# Patient Record
Sex: Male | Born: 1970 | Race: White | Hispanic: No | Marital: Married | Smoking: Never smoker
Health system: Southern US, Community
[De-identification: ages and names within clinical notes are randomized; demographics above are authoritative.]

## PROBLEM LIST (undated history)

## (undated) DIAGNOSIS — T7840XA Allergy, unspecified, initial encounter: Secondary | ICD-10-CM

## (undated) DIAGNOSIS — G43909 Migraine, unspecified, not intractable, without status migrainosus: Secondary | ICD-10-CM

## (undated) DIAGNOSIS — Z8659 Personal history of other mental and behavioral disorders: Secondary | ICD-10-CM

## (undated) DIAGNOSIS — R519 Headache, unspecified: Secondary | ICD-10-CM

## (undated) HISTORY — DX: Headache, unspecified: R51.9

## (undated) HISTORY — PX: FINGER FRACTURE SURGERY: SHX638

## (undated) HISTORY — PX: FRACTURE SURGERY: SHX138

## (undated) HISTORY — DX: Personal history of other mental and behavioral disorders: Z86.59

## (undated) HISTORY — DX: Migraine, unspecified, not intractable, without status migrainosus: G43.909

## (undated) HISTORY — DX: Allergy, unspecified, initial encounter: T78.40XA

## (undated) HISTORY — PX: CYST EXCISION: SHX5701

---

## 2019-03-02 ENCOUNTER — Other Ambulatory Visit: Payer: Self-pay

## 2019-03-02 ENCOUNTER — Encounter: Payer: Self-pay | Admitting: Urology

## 2019-03-02 ENCOUNTER — Ambulatory Visit (INDEPENDENT_AMBULATORY_CARE_PROVIDER_SITE_OTHER): Payer: 59 | Admitting: Urology

## 2019-03-02 VITALS — BP 146/84 | HR 76 | Ht 74.0 in | Wt 218.0 lb

## 2019-03-02 DIAGNOSIS — Z3009 Encounter for other general counseling and advice on contraception: Secondary | ICD-10-CM | POA: Diagnosis not present

## 2019-03-02 MED ORDER — DIAZEPAM 5 MG PO TABS: 5.0000 mg | ORAL_TABLET | Freq: Once | ORAL | 0 refills | Status: DC | PRN

## 2019-03-02 NOTE — Progress Notes (Signed)
03/02/19 2:35 PM   Ardyth Man Nov 11, 1970 [id number]  CC: Discuss vasectomy  HPI: I saw Mr. Braunstein for evaluation of vasectomy today in clinic.  He is a healthy 48 year old male who is married with 2 children ages 22 and 91.  His wife is currently on oral contraceptive pills, but is having some issues, and they would desire permanent sterilization with vasectomy.  He is otherwise healthy.  He denies a family history of prostate cancer.   PMH: Past Medical History:  Diagnosis Date  . Headache     Surgical History: No pertinent surgical history  Allergies:  Allergies  Allergen Reactions  . Codeine Shortness Of Breath  . Penicillins Hives    Family History: Denies family history of prostate cancer  Social History:  reports that he has never smoked. He has never used smokeless tobacco. He reports current alcohol use. No history on file for drug.  ROS: Please see flowsheet from today's date for complete review of systems.  Physical Exam: BP (!) 146/84   Pulse 76   Ht 6\' 2"  (1.88 m)   Wt 218 lb (98.9 kg)   BMI 27.99 kg/m    Constitutional:  Alert and oriented, No acute distress. Cardiovascular: No clubbing, cyanosis, or edema. Respiratory: Normal respiratory effort, no increased work of breathing. GI: Abdomen is soft, nontender, nondistended, no abdominal masses GU: No CVA tenderness, phallus without lesions, widely patent meatus, testicles 20 cc, no masses, vas deferens easily palpable bilaterally Lymph: No cervical or inguinal lymphadenopathy. Skin: No rashes, bruises or suspicious lesions. Neurologic: Grossly intact, no focal deficits, moving all 4 extremities. Psychiatric: Normal mood and affect.  Assessment & Plan:   In summary, he is a healthy 48 year old male who desires surgical sterilization with vasectomy.  We discussed the risks and benefits of vasectomy at length.  Vasectomy is intended to be a permanent form of contraception, and does not produce  immediate sterility.  Following vasectomy another form of contraception is required until vas occlusion is confirmed by a post-vasectomy semen analysis obtained 2-3 months after the procedure.  Even after vas occlusion is confirmed, vasectomy is not 100% reliable in preventing pregnancy, and the failure rate is approximately 05/1998.  Repeat vasectomy is required in less than 1% of patients.  He should refrain from ejaculation for 1 week after vasectomy.  Options for fertility after vasectomy include vasectomy reversal, and sperm retrieval with in vitro fertilization or ICSI.  These options are not always successful and may be expensive.  Finally, there are other permanent and non-permanent alternatives to vasectomy available. There is no risk of erectile dysfunction, and the volume of semen will be similar to prior, as the majority of the ejaculate is from the prostate and seminal vesicles.   The procedure takes ~20 minutes.  We recommend patients take 5-10 mg of Valium 30 minutes prior, and he will need a driver post-procedure.  Local anesthetic is injected into the scrotal skin and a small segment of the vas deferens is removed, and the ends occluded. The complication rate is approximately 1-2%, and includes bleeding, infection, and development of chronic scrotal pain.  PLAN: Pending insurance approval, will schedule for vasectomy at his convenience   A total of 30 minutes were spent face-to-face with the patient, greater than 50% was spent in patient education, counseling, and coordination of care regarding risks, benefits, and alternatives to vasectomy.   Billey Co, MD  [facility] [address] [place], [place] [address] [phone]  [phone]   

## 2019-03-02 NOTE — Patient Instructions (Signed)

## 2019-04-04 ENCOUNTER — Ambulatory Visit (INDEPENDENT_AMBULATORY_CARE_PROVIDER_SITE_OTHER): Payer: 59 | Admitting: Urology

## 2019-04-04 ENCOUNTER — Encounter: Payer: Self-pay | Admitting: Urology

## 2019-04-04 ENCOUNTER — Other Ambulatory Visit: Payer: Self-pay

## 2019-04-04 VITALS — BP 129/80 | HR 64 | Ht 74.0 in | Wt 218.0 lb

## 2019-04-04 DIAGNOSIS — Z302 Encounter for sterilization: Secondary | ICD-10-CM

## 2019-04-04 HISTORY — PX: VASECTOMY: SHX75

## 2019-04-04 NOTE — Patient Instructions (Signed)
Vasectomy, Care After  This sheet gives you information about how to care for yourself after your procedure. Your health care provider may also give you more specific instructions. If you have problems or questions, contact your health care provider.  What can I expect after the procedure?  After your procedure, it is common to have:   Mild pain, swelling, redness, or discomfort in your scrotum.   Some blood coming from your incisions or puncture sites for one or two days.   Blood in your semen.  Follow these instructions at home:  Medicines     Take over-the-counter and prescription medicines only as told by your health care provider.   Avoid taking NSAIDs such as aspirin and ibuprofen, because these medicines can make bleeding worse.  Activity   For the first 2 days after surgery, avoid physical activity and exercise that require a lot of energy. Ask your health care provider what activities are safe for you.   Do not participate in sports or perform heavy physical labor until your pain has improved, or until your health care provider says it is okay.   Do not ejaculate for at least 1 week after the procedure, or as long as directed.   You may resume sexual activity 7-10 days after your procedure, or when your health care provider approves. Use a different method of birth control (contraception) until you have had test results that confirm that there is no sperm in your semen.  Scrotal support   Use scrotal support, such as a jock strap or underwear with a supportive pouch, as needed for one week after your procedure.   If you feel discomfort in your scrotum, you may remove the scrotal support to see if the discomfort is relieved. Sometimes scrotal support can press on the scrotum and cause or worsen discomfort.   If your skin gets irritated, you may add some germ-free (sterile), fluffed bandages or a clean washcloth to the scrotal support.  General instructions   Put ice on the injured area:  ? Put  ice in a plastic bag.  ? Place a towel between your skin and the bag.  ? Leave the ice on for 20 minutes, 2-3 times a day.   Check your incisions or puncture sites every day for signs of infection. Check for:  ? Redness, swelling, or pain.  ? Fluid or blood.  ? Warmth.  ? Pus or a bad smell.   Leave stitches (sutures) in place. The sutures will dissolve on their own and do not need to be removed.   Keep all follow-up visits as told by your health care provider. This is important because you will need a test to confirm that there is no sperm in your semen. Multiple ejaculations are needed to clear out sperm that were beyond the vasectomy site. You will need one test result showing that there is no sperm in your semen before you can resume unprotected sex. This may take 2-4 months after your procedure.   Do not drive for 24 hours if you were given a sedative to help you relax.  Contact a health care provider if:   You have redness, swelling, or more pain around your incision or puncture site, or in your scrotum area in general.   You have bleeding from your incision or puncture site.   You have pus or a bad smell coming from your incision or puncture site.   You have a fever.   Your incision or   Avoid physical activity and exercise that requires a lot of energy for the first 2 days after surgery.  Put ice on the injured area. Leave the ice on for 20 minutes, 2-3 times a day.  Do not drive for 24 hours if you were given a sedative to help you relax. This information is not intended to replace advice given to you by your health care provider. Make sure you discuss any questions you have with your health care provider. Document Released: 11/22/2004 Document Revised: 04/17/2017 Document  Reviewed: 08/01/2016 Elsevier Patient Education  [place].

## 2019-04-04 NOTE — Progress Notes (Signed)
VASECTOMY PROCEDURE NOTE:  The patient was taken to the minor procedure room and placed in the supine position. His genitals were prepped and draped in the usual sterile fashion. The right vas deferens was brought up to the skin of the right upper scrotum. The skin overlying it was anesthetized with 1% lidocaine without epinephrine, anesthetic was also injected alongside the vas deferens in the direction of the inguinal canal. The no scalpel vasectomy instrument was used to make a small perforation in the scrotal skin. The vasectomy clamp was used to grasp the vas deferens. It was carefully dissected free from surrounding structures. A 1cm segment of the vas was removed, and the cut ends of the mucosa were cauterized. A figure of eight suture was used to perform fascial interposition. No significant bleeding was noted. The vas deferens was returned to the scrotum. The skin incision was closed with a simple interrupted stitch of 4-0 chromic.  Attention was then turned to the left side. The left vasectomy was performed in the same exact fashion. Sterile dressings were placed over each incision. The patient tolerated the procedure well.  IMPRESSION/DIAGNOSIS: The patient is a 48 year old gentleman who underwent a vasectomy today. Post-procedure instructions were reviewed. I stressed the importance of continuing to use birth control until he provides a semen specimen more than 2 months from now that demonstrates azoospermia.  We discussed return precautions including fever over 101, significant bleeding or hematoma, or uncontrolled pain. I also stressed the importance of avoiding strenuous activity for one week, no sexual activity or ejaculations for 5 days, intermittent icing over the next 48 hours, and scrotal support.   PLAN: The patient will be advised of his semen analysis results when available.   Nickolas Madrid, MD 04/04/2019

## 2019-06-30 ENCOUNTER — Other Ambulatory Visit: Payer: 59

## 2019-07-07 ENCOUNTER — Other Ambulatory Visit: Payer: 59

## 2019-07-14 ENCOUNTER — Other Ambulatory Visit: Payer: Self-pay

## 2019-07-14 ENCOUNTER — Other Ambulatory Visit: Payer: 59

## 2019-07-14 DIAGNOSIS — Z302 Encounter for sterilization: Secondary | ICD-10-CM

## 2019-07-15 LAB — POST-VAS SPERM EVALUATION,QUAL: Volume: 1.4 mL

## 2019-07-18 ENCOUNTER — Telehealth: Payer: Self-pay

## 2019-07-18 NOTE — Telephone Encounter (Signed)
Called pt informed him of the information below. Pt gave verbal understanding.  

## 2019-07-18 NOTE — Telephone Encounter (Signed)
-----   Message from Billey Co, MD sent at 07/15/2019  5:28 PM EST ----- No sperm seen, ok to stop alternative protection  Nickolas Madrid, MD 07/15/2019

## 2019-09-10 ENCOUNTER — Ambulatory Visit: Payer: Self-pay | Attending: Internal Medicine

## 2019-09-10 DIAGNOSIS — Z23 Encounter for immunization: Secondary | ICD-10-CM

## 2019-09-10 NOTE — Progress Notes (Signed)
   Covid-19 Vaccination Clinic  Name:  Joel Oconnor    MRN: [id number] DOB: 01-Jan-1971  09/10/2019  Mr. Joel Oconnor was observed post Covid-19 immunization for 15 minutes without incident. He was provided with Vaccine Information Sheet and instruction to access the V-Safe system.   Mr. Joel Oconnor was instructed to call 911 with any severe reactions post vaccine: Marland Kitchen Difficulty breathing  . Swelling of face and throat  . A fast heartbeat  . A bad rash all over body  . Dizziness and weakness   Immunizations Administered    Name Date Dose VIS Date Route   Pfizer COVID-19 Vaccine 09/10/2019 10:58 AM 0.3 mL 07/13/2018 Intramuscular   Manufacturer: [place]   Lot: [id number]   [place]: [id number]

## 2019-10-08 ENCOUNTER — Ambulatory Visit: Payer: Self-pay | Attending: Internal Medicine

## 2019-10-08 DIAGNOSIS — Z23 Encounter for immunization: Secondary | ICD-10-CM

## 2019-10-08 NOTE — Progress Notes (Signed)
   Covid-19 Vaccination Clinic  Name:  Joel Oconnor    MRN: [id number] DOB: 16-Feb-1971  10/08/2019  Joel Oconnor was observed post Covid-19 immunization for 15 minutes without incident. He was provided with Vaccine Information Sheet and instruction to access the V-Safe system.   Joel Oconnor was instructed to call 911 with any severe reactions post vaccine: Marland Kitchen Difficulty breathing  . Swelling of face and throat  . A fast heartbeat  . A bad rash all over body  . Dizziness and weakness   Immunizations Administered    Name Date Dose VIS Date Route   Pfizer COVID-19 Vaccine 10/08/2019 10:42 AM 0.3 mL 07/13/2018 Intramuscular   Manufacturer: [employer], [employer]   Lot: [id number]   [place]: [id number]

## 2021-09-12 ENCOUNTER — Telehealth: Payer: Self-pay

## 2021-09-12 ENCOUNTER — Ambulatory Visit (INDEPENDENT_AMBULATORY_CARE_PROVIDER_SITE_OTHER)
Admission: RE | Admit: 2021-09-12 | Discharge: 2021-09-12 | Disposition: A | Discharge status: Home / Self Care | Payer: 59 | Source: Ambulatory Visit | Attending: Nurse Practitioner | Admitting: Nurse Practitioner

## 2021-09-12 ENCOUNTER — Ambulatory Visit (INDEPENDENT_AMBULATORY_CARE_PROVIDER_SITE_OTHER): Payer: 59 | Admitting: Nurse Practitioner

## 2021-09-12 ENCOUNTER — Other Ambulatory Visit: Payer: Self-pay

## 2021-09-12 ENCOUNTER — Encounter: Payer: Self-pay | Admitting: Nurse Practitioner

## 2021-09-12 VITALS — BP 132/94 | HR 73 | Temp 97.5°F | Resp 12 | Ht 74.0 in | Wt 230.1 lb

## 2021-09-12 DIAGNOSIS — Z0000 Encounter for general adult medical examination without abnormal findings: Secondary | ICD-10-CM

## 2021-09-12 DIAGNOSIS — E663 Overweight: Secondary | ICD-10-CM

## 2021-09-12 DIAGNOSIS — Z1211 Encounter for screening for malignant neoplasm of colon: Secondary | ICD-10-CM

## 2021-09-12 DIAGNOSIS — M25511 Pain in right shoulder: Secondary | ICD-10-CM

## 2021-09-12 DIAGNOSIS — Z23 Encounter for immunization: Secondary | ICD-10-CM | POA: Diagnosis not present

## 2021-09-12 DIAGNOSIS — G8929 Other chronic pain: Secondary | ICD-10-CM

## 2021-09-12 LAB — CBC WITH DIFFERENTIAL/PLATELET
Basophils Absolute: 0 10*3/uL (ref 0.0–0.1)
Basophils Relative: 0.4 % (ref 0.0–3.0)
Eosinophils Absolute: 0.1 10*3/uL (ref 0.0–0.7)
Eosinophils Relative: 1.1 % (ref 0.0–5.0)
HCT: 45.4 % (ref 39.0–52.0)
Hemoglobin: 15.5 g/dL (ref 13.0–17.0)
Lymphocytes Relative: 31.3 % (ref 12.0–46.0)
Lymphs Abs: 1.8 10*3/uL (ref 0.7–4.0)
MCHC: 34.1 g/dL (ref 30.0–36.0)
MCV: 82.4 fl (ref 78.0–100.0)
Monocytes Absolute: 0.5 10*3/uL (ref 0.1–1.0)
Monocytes Relative: 9.1 % (ref 3.0–12.0)
Neutro Abs: 3.4 10*3/uL (ref 1.4–7.7)
Neutrophils Relative %: 58.1 % (ref 43.0–77.0)
Platelets: 324 10*3/uL (ref 150.0–400.0)
RBC: 5.51 Mil/uL (ref 4.22–5.81)
RDW: 13.8 % (ref 11.5–15.5)
WBC: 5.9 10*3/uL (ref 4.0–10.5)

## 2021-09-12 LAB — TSH: TSH: 4.6 u[IU]/mL (ref 0.35–5.50)

## 2021-09-12 LAB — LIPID PANEL
Cholesterol: 273 mg/dL — ABNORMAL HIGH (ref 0–200)
HDL: 46.2 mg/dL (ref >39.00)
LDL Cholesterol: 191 mg/dL — ABNORMAL HIGH (ref 0–99)
NonHDL: 226.74
Total CHOL/HDL Ratio: 6
Triglycerides: 178 mg/dL — ABNORMAL HIGH (ref 0.0–149.0)
VLDL: 35.6 mg/dL (ref 0.0–40.0)

## 2021-09-12 LAB — COMPREHENSIVE METABOLIC PANEL
ALT: 29 U/L (ref 0–53)
AST: 21 U/L (ref 0–37)
Albumin: 4.9 g/dL (ref 3.5–5.2)
Alkaline Phosphatase: 82 U/L (ref 39–117)
BUN: 15 mg/dL (ref 6–23)
CO2: 23 mEq/L (ref 19–32)
Calcium: 9.2 mg/dL (ref 8.4–10.5)
Chloride: 104 mEq/L (ref 96–112)
Creatinine, Ser: 0.96 mg/dL (ref 0.40–1.50)
GFR: 92.62 mL/min (ref >60.00)
Glucose, Bld: 81 mg/dL (ref 70–99)
Potassium: 4.4 mEq/L (ref 3.5–5.1)
Sodium: 137 mEq/L (ref 135–145)
Total Bilirubin: 0.5 mg/dL (ref 0.2–1.2)
Total Protein: 7.2 g/dL (ref 6.0–8.3)

## 2021-09-12 LAB — HEMOGLOBIN A1C: Hgb A1c MFr Bld: 5.6 % (ref 4.6–6.5)

## 2021-09-12 MED ORDER — NA SULFATE-K SULFATE-MG SULF 17.5-3.13-1.6 GM/177ML PO SOLN: 1.0000 | Freq: Once | ORAL | 0 refills | Status: AC

## 2021-09-12 NOTE — Progress Notes (Signed)
? ?New Patient Office Visit ? ?Subjective   ? ?Patient ID: Joel Oconnor, male    DOB: 1970-06-22  Age: 51 y.o. MRN: [id number] ? ?CC:  ?Chief Complaint  ?Patient presents with  ? Establish Care  ?  Previous PCP over 25 years ago  ? Shoulder Pain  ?  Right shoulder pain off and on for 1 year. Thinks it was from hyperextending it about a year ago grabbing something in a awkward position at that time. At first when this happened for about 3 to 4 days he could not move his arm at all.   ? ? ?HPI ?[name] presents to establish care ?for complete physical and follow up of chronic conditions. ? ?Immunizations: ?-Tetanus:needs updating ?-Influenza:refused ?-Covid-19:pfizer x2  ?-Shingles: ?-Pneumonia:  ? ?-HPV: ? ?Diet: Fair diet.  2 meals a day. Snacks in between. More water and 2 cups of coffee a day. ?Exercise: No regular exercise. ? ?Eye exam: Completes annually. 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 ?Lung Cancer Screening: NA ? ?Sleep: 11pm-1am bedtime and gets up between 730-8am. Feels rested somedays. Snores. ? ?Shoulder, right: states approx 1-1.5 years ago. States it felt like a pull. It was an awkard position when getting something. States his wife is a OTA. She thinks it is bicep muscle. Has not tried OTC medicine. Denies numbness or tingling or weakness ? ?Migraine: any time weather changes. He will use excedrin. At least monthly. Excedrin does help and he will sleep. Photosen, smells, sound. Vary in location. Will obtian a rainbow halo with them at times ?Has had N/V with them. Has been evaluated by nuerology. [place] in the past. ? ?Headaches:several times a week.  He would not use Excedrin all the times.  Lots time just ignores them. ? ? ?Outpatient Encounter Medications as of 09/12/2021  ?Medication Sig  ? aspirin-acetaminophen-caffeine (EXCEDRIN MIGRAINE) 250-250-65 MG tablet Take by mouth every 6 (six) hours as needed for headache.  ?  [DISCONTINUED] diazepam (VALIUM) 5 MG tablet Take 1 tablet (5 mg total) by mouth once as needed for up to 1 dose (take 30 minutes prior to vasectomy).  ? ?No facility-administered encounter medications on file as of 09/12/2021.  ? ? ?Past Medical History:  ?Diagnosis Date  ? Allergy   ? Headache   ? History of ADHD   ? Migraine   ? ? ? ? ?Family History  ?Problem Relation Age of Onset  ? COPD Father   ?     on oxygen  ? Hypertension Father   ? Lung cancer Father   ? Other Father   ?     asbestosis  ? Heart attack Father   ?     has 2 stents  ? Hypertension Sister   ? Liver cancer Sister   ? Stroke Maternal Grandfather   ? Memory loss Paternal Grandfather   ? ? ?Social History  ? ?Socioeconomic History  ? Marital status: Married  ?  Spouse name: Not on file  ? Number of children: 2  ? Years of education: Not on file  ? Highest education level: Bachelor's degree (e.g., BA, AB, BS)  ?Occupational History  ? Not on file  ?Tobacco Use  ? Smoking status: Never  ? Smokeless tobacco: Never  ?Vaping Use  ? Vaping Use: Never used  ?Substance and Sexual Activity  ? Alcohol use: Yes  ?  Comment: maybe a 6 pack a year  ? Drug use: Never  ?  Sexual activity: Yes  ?  Birth control/protection: Surgical  ?  Comment: Vasectomy 04/04/2019  ?Other Topics Concern  ? Not on file  ?Social History Narrative  ? Fulltime: Careers information officer  ? Estate manager/land agent  ? ?Social Determinants of Health  ? ?Financial Resource Strain: Not on file  ?Food Insecurity: Not on file  ?Transportation Needs: Not on file  ?Physical Activity: Not on file  ?Stress: Not on file  ?Social Connections: Not on file  ?Intimate Partner Violence: Not on file  ? ? ?Review of Systems  ?Constitutional:  Negative for chills, fever and malaise/fatigue.  ?Respiratory:  Positive for shortness of breath. Negative for cough.   ?Cardiovascular:  Positive for chest pain and leg swelling. Negative for palpitations.  ?Gastrointestinal:  Negative for abdominal pain, diarrhea, nausea  and vomiting.  ?     BM daily ?  ?Genitourinary:  Negative for dysuria and hematuria.  ?     Nocturia with fluid intake  ?Musculoskeletal:  Positive for joint pain.  ?Neurological:  Positive for headaches.  ?Psychiatric/Behavioral:  Negative for hallucinations and suicidal ideas.   ?     History of SI and self injury as an teenager.  ? ?  ? ? ?Objective   ? ?BP (!) 132/94   Pulse 73   Temp (!) 97.5 ?F (36.4 ?C)   Resp 12   Ht '6\' 2"'$  (1.88 m)   Wt 230 lb 1 oz (104.4 kg)   SpO2 95%   BMI 29.54 kg/m?  ? ?Physical Exam ?Vitals and nursing note reviewed. Exam conducted with a chaperone present [employer] Odee, CMA).  ?Constitutional:   ?   Appearance: Normal appearance.  ?HENT:  ?   Right Ear: Tympanic membrane, ear canal and external ear normal.  ?   Left Ear: Tympanic membrane, ear canal and external ear normal.  ?   Mouth/Throat:  ?   Mouth: Mucous membranes are moist.  ?   Pharynx: Oropharynx is clear.  ?Eyes:  ?   Extraocular Movements: Extraocular movements intact.  ?   Pupils: Pupils are equal, round, and reactive to light.  ?Cardiovascular:  ?   Rate and Rhythm: Normal rate and regular rhythm.  ?   Pulses: Normal pulses.  ?   Heart sounds: Normal heart sounds.  ?Pulmonary:  ?   Effort: Pulmonary effort is normal.  ?   Breath sounds: Normal breath sounds.  ?Abdominal:  ?   General: Bowel sounds are normal. There is no distension.  ?   Palpations: There is no mass.  ?   Tenderness: There is no abdominal tenderness.  ?   Hernia: No hernia is present. There is no hernia in the left inguinal area or right inguinal area.  ?Genitourinary: ?   Penis: Normal and circumcised.   ?   Testes: Normal.  ?   Epididymis:  ?   Right: Normal.  ?   Left: Normal.  ?Musculoskeletal:     ?   General: Tenderness present. No signs of injury.  ?     Arms: ? ?   Right lower leg: No edema.  ?   Comments: Area is inferior to joint space. Pec muscle non tender. Bicep muscle non tender. ? ?FROM in office.  ?Empty can test  negative ?Hawkins-Kennedy test negative   ?Lymphadenopathy:  ?   Cervical: No cervical adenopathy.  ?   Lower Body: No right inguinal adenopathy. No left inguinal adenopathy.  ?Skin: ?   General: Skin is warm.  ?  Neurological:  ?   General: No focal deficit present.  ?   Mental Status: He is alert.  ?   Deep Tendon Reflexes:  ?   Reflex Scores: ?     Bicep reflexes are 2+ on the right side and 2+ on the left side. ?     Patellar reflexes are 2+ on the right side and 2+ on the left side. ?   Comments: Bilateral upper and lower extremity 5/5  ?Psychiatric:     ?   Mood and Affect: Mood normal.     ?   Behavior: Behavior normal.     ?   Thought Content: Thought content normal.     ?   Judgment: Judgment normal.  ? ? ? ?  ? ?Assessment & Plan:  ? ?Problem List Items Addressed This Visit   ? ?  ? Other  ? Overweight  ? Relevant Orders  ? Hemoglobin A1c  ? Preventative health care  ?  Discussed age-appropriate immunizations and screening exams. ? ?  ?  ? Relevant Orders  ? CBC with Differential/Platelet  ? Comprehensive metabolic panel  ? Hemoglobin A1c  ? TSH  ? Lipid panel  ? Chronic right shoulder pain - Primary  ?  Patient has been experiencing for over a year.  It is intermittent in nature.  Does not try over-the-counter medications.  Will obtain x-ray of right shoulder to rule out any bony abnormality inclusive of arthritis and bone spurs.  If this keeps happening we can consider physical therapy or sending him to our sports medicine physician Dr. Lorelei Pont for consultation. ? ?  ?  ? Relevant Orders  ? DG Shoulder Right  ? ?Other Visit Diagnoses   ? ? Need for tetanus booster      ? Relevant Orders  ? Tdap vaccine greater than or equal to 7yo IM (Completed)  ? Screening for colon cancer      ? Relevant Orders  ? Ambulatory referral to Gastroenterology  ? ?  ? ? ?Return in about 1 year (around 09/13/2022) for cpe.  ? ?Romilda Garret, NP ? ? ?

## 2021-09-12 NOTE — Assessment & Plan Note (Signed)
Patient has been experiencing for over a year.  It is intermittent in nature.  Does not try over-the-counter medications.  Will obtain x-ray of right shoulder to rule out any bony abnormality inclusive of arthritis and bone spurs.  If this keeps happening we can consider physical therapy or sending him to our sports medicine physician Dr. Lorelei Pont for consultation. ?

## 2021-09-12 NOTE — Progress Notes (Signed)
Colonoscopy 09/17/21 with Dr. Vicente Males at [facility]. ?

## 2021-09-12 NOTE — Telephone Encounter (Signed)
Gastroenterology Pre-Procedure Review ? ?Request Date: 09/17/21 ?Requesting Physician: Dr. Vicente Males ? ?PATIENT REVIEW QUESTIONS: The patient responded to the following health history questions as indicated:   ? ?1. Are you having any GI issues? no ?2. Do you have a personal history of Polyps? no ?3. Do you have a family history of Colon Cancer or Polyps? no ?4. Diabetes Mellitus? no ?5. Joint replacements in the past 12 months?no ?6. Major health problems in the past 3 months?no ?7. Any artificial heart valves, MVP, or defibrillator?no ?   ?MEDICATIONS & ALLERGIES:    ?Patient reports the following regarding taking any anticoagulation/antiplatelet therapy:   ?Plavix, Coumadin, Eliquis, Xarelto, Lovenox, Pradaxa, Brilinta, or Effient? no ?Aspirin? no ? ?Patient confirms/reports the following medications:  ?Current Outpatient Medications  ?Medication Sig Dispense Refill  ? aspirin-acetaminophen-caffeine (EXCEDRIN MIGRAINE) 250-250-65 MG tablet Take by mouth every 6 (six) hours as needed for headache.    ? ?No current facility-administered medications for this visit.  ? ? ?Patient confirms/reports the following allergies:  ?Allergies  ?Allergen Reactions  ? Bee Venom Hives  ? Codeine Shortness Of Breath  ? Penicillins Hives  ? ? ?No orders of the defined types were placed in this encounter. ? ? ?AUTHORIZATION INFORMATION ?Primary Insurance: ?1D#: ?Group #: ? ?Secondary Insurance: ?1D#: ?Group #: ? ?SCHEDULE INFORMATION: ?Date: 09/17/21 ?Time: ?Location: [facility] ?

## 2021-09-12 NOTE — Patient Instructions (Signed)
Nice to see you today ?I will be in touch with the labs and xray results ?Follow up with me in 1 year for a physical, sooner if you need me ? ?

## 2021-09-12 NOTE — Assessment & Plan Note (Signed)
Discussed age-appropriate immunizations and screening exams. 

## 2021-09-13 ENCOUNTER — Other Ambulatory Visit: Payer: Self-pay | Admitting: Nurse Practitioner

## 2021-09-13 DIAGNOSIS — E7800 Pure hypercholesterolemia, unspecified: Secondary | ICD-10-CM

## 2021-09-17 ENCOUNTER — Encounter: Payer: Self-pay | Admitting: Gastroenterology

## 2021-09-17 ENCOUNTER — Ambulatory Visit: Payer: 59 | Admitting: Anesthesiology

## 2021-09-17 ENCOUNTER — Encounter
Admission: RE | Disposition: A | Discharge status: Home / Self Care | Payer: Self-pay | Source: Home / Self Care | Attending: Gastroenterology

## 2021-09-17 ENCOUNTER — Ambulatory Visit
Admission: RE | Admit: 2021-09-17 | Discharge: 2021-09-17 | Disposition: A | Discharge status: Home / Self Care | Payer: 59 | Attending: Gastroenterology | Admitting: Gastroenterology

## 2021-09-17 DIAGNOSIS — K635 Polyp of colon: Secondary | ICD-10-CM | POA: Diagnosis not present

## 2021-09-17 DIAGNOSIS — G4730 Sleep apnea, unspecified: Secondary | ICD-10-CM | POA: Insufficient documentation

## 2021-09-17 DIAGNOSIS — Z1211 Encounter for screening for malignant neoplasm of colon: Secondary | ICD-10-CM | POA: Insufficient documentation

## 2021-09-17 DIAGNOSIS — R519 Headache, unspecified: Secondary | ICD-10-CM | POA: Diagnosis not present

## 2021-09-17 DIAGNOSIS — D124 Benign neoplasm of descending colon: Secondary | ICD-10-CM | POA: Insufficient documentation

## 2021-09-17 HISTORY — PX: COLONOSCOPY WITH PROPOFOL: SHX5780

## 2021-09-17 SURGERY — COLONOSCOPY WITH PROPOFOL
Anesthesia: General

## 2021-09-17 MED ORDER — PROPOFOL 500 MG/50ML IV EMUL
INTRAVENOUS | Status: DC | PRN
Start: 2021-09-17 — End: 2021-09-17
  Administered 2021-09-17: 150 ug/kg/min via INTRAVENOUS

## 2021-09-17 MED ORDER — SODIUM CHLORIDE 0.9 % IV SOLN: INTRAVENOUS | Status: DC

## 2021-09-17 MED ORDER — LIDOCAINE HCL (CARDIAC) PF 100 MG/5ML IV SOSY
PREFILLED_SYRINGE | INTRAVENOUS | Status: DC | PRN
  Administered 2021-09-17: 50 mg via INTRAVENOUS

## 2021-09-17 MED ORDER — PROPOFOL 10 MG/ML IV BOLUS
INTRAVENOUS | Status: AC
  Filled 2021-09-17: qty 20

## 2021-09-17 MED ORDER — PROPOFOL 500 MG/50ML IV EMUL
INTRAVENOUS | Status: AC
  Filled 2021-09-17: qty 50

## 2021-09-17 MED ORDER — PROPOFOL 10 MG/ML IV BOLUS
INTRAVENOUS | Status: DC | PRN
  Administered 2021-09-17: 30 mg via INTRAVENOUS
  Administered 2021-09-17: 70 mg via INTRAVENOUS

## 2021-09-17 MED ORDER — DEXMEDETOMIDINE (PRECEDEX) IN NS 20 MCG/5ML (4 MCG/ML) IV SYRINGE
PREFILLED_SYRINGE | INTRAVENOUS | Status: DC | PRN
  Administered 2021-09-17: 8 ug via INTRAVENOUS

## 2021-09-17 NOTE — Op Note (Signed)
[facility] ?Gastroenterology ?Patient Name: Naveen Clardy ?Procedure Date: 09/17/2021 10:16 AM ?MRN: [id number] ?Account #: [id number] ?Date of Birth: Sep 02, 1970 ?Admit Type: Outpatient ?Age: 51 ?Room: [facility] ENDO ROOM 2 ?Gender: Male ?Note Status: Finalized ?Instrument Name: Colonscope [id number] ?Procedure:             Colonoscopy ?Indications:           Screening for colorectal malignant neoplasm ?Providers:             Jonathon Bellows MD, MD ?Medicines:             Monitored Anesthesia Care ?Complications:         No immediate complications. ?Procedure:             Pre-Anesthesia Assessment: ?                       - Prior to the procedure, a History and Physical was  ?                       performed, and patient medications, allergies and  ?                       sensitivities were reviewed. The patient's tolerance  ?                       of previous anesthesia was reviewed. ?                       - The risks and benefits of the procedure and the  ?                       sedation options and risks were discussed with the  ?                       patient. All questions were answered and informed  ?                       consent was obtained. ?                       - ASA Grade Assessment: II - A patient with mild  ?                       systemic disease. ?                       After obtaining informed consent, the colonoscope was  ?                       passed under direct vision. Throughout the procedure,  ?                       the patient's blood pressure, pulse, and oxygen  ?                       saturations were monitored continuously. The  ?                       Colonoscope was introduced through the anus and  ?  advanced to the the cecum, identified by the  ?                       appendiceal orifice. The colonoscopy was performed  ?                       with ease. The patient tolerated the procedure well.  ?                       The quality of the bowel preparation  was excellent. ?Findings: ?     The perianal and digital rectal examinations were normal. ?     Three sessile polyps were found in the descending colon. The polyps were  ?     4 to 6 mm in size. These polyps were removed with a cold snare.  ?     Resection and retrieval were complete. ?     The exam was otherwise without abnormality on direct and retroflexion  ?     views. ?Impression:            - Three 4 to 6 mm polyps in the descending colon,  ?                       removed with a cold snare. Resected and retrieved. ?                       - The examination was otherwise normal on direct and  ?                       retroflexion views. ?Recommendation:        - Discharge patient to home (with escort). ?                       - Resume previous diet. ?                       - Continue present medications. ?                       - Await pathology results. ?                       - Repeat colonoscopy for surveillance based on  ?                       pathology results. ?Procedure Code(s):     --- Professional --- ?                       [id number], Colonoscopy, flexible; with removal of  ?                       tumor(s), polyp(s), or other lesion(s) by snare  ?                       technique ?Diagnosis Code(s):     --- Professional --- ?                       Z12.11, Encounter for screening for malignant neoplasm  ?  of colon ?                       K63.5, Polyp of colon ?CPT copyright 2019 American Medical Association. All rights reserved. ?The codes documented in this report are preliminary and upon coder review may  ?be revised to meet current compliance requirements. ?Jonathon Bellows, MD ?Jonathon Bellows MD, MD ?09/17/2021 10:40:24 AM ?This report has been signed electronically. ?Number of Addenda: 0 ?Note Initiated On: 09/17/2021 10:16 AM ?Scope Withdrawal Time: 0 hours 13 minutes 6 seconds  ?Total Procedure Duration: 0 hours 18 minutes 16 seconds  ?Estimated Blood Loss:  Estimated blood loss: none. ?      [facility] ?

## 2021-09-17 NOTE — H&P (Signed)
? ? ? ?  Jonathon Bellows, MD ?[address], [place], [place], [place], [address] ?[address], [place], [place], [place], [address] ?Phone: [phone]  ?Fax: [phone] ? ?Primary Care Physician:  Michela Pitcher, NP ? ? ?Pre-Procedure History & Physical: ?HPI:  Joel Oconnor is a 51 y.o. male is here for an colonoscopy. ?  ?Past Medical History:  ?Diagnosis Date  ? Allergy   ? Headache   ? History of ADHD   ? Migraine   ? ? ? ? ?Prior to Admission medications   ?Medication Sig Start Date End Date Taking? Authorizing Provider  ?aspirin-acetaminophen-caffeine (EXCEDRIN MIGRAINE) 250-250-65 MG tablet Take by mouth every 6 (six) hours as needed for headache.    [provider]  ? ? ?Allergies as of 09/12/2021 - Review Complete 09/12/2021  ?Allergen Reaction Noted  ? Bee venom Hives 09/12/2021  ? Codeine Shortness Of Breath 03/02/2019  ? Penicillins Hives 03/02/2019  ? ? ?Family History  ?Problem Relation Age of Onset  ? COPD Father   ?     on oxygen  ? Hypertension Father   ? Lung cancer Father   ? Other Father   ?     asbestosis  ? Heart attack Father   ?     has 2 stents  ? Hypertension Sister   ? Liver cancer Sister   ? Stroke Maternal Grandfather   ? Memory loss Paternal Grandfather   ? ? ?Social History  ? ?Socioeconomic History  ? Marital status: Married  ?  Spouse name: Not on file  ? Number of children: 2  ? Years of education: Not on file  ? Highest education level: Bachelor's degree (e.g., BA, AB, BS)  ?Occupational History  ? Not on file  ?Tobacco Use  ? Smoking status: Never  ? Smokeless tobacco: Never  ?Vaping Use  ? Vaping Use: Never used  ?Substance and Sexual Activity  ? Alcohol use: Yes  ?  Comment: maybe a 6 pack a year  ? Drug use: Never  ? Sexual activity: Yes  ?  Birth control/protection: Surgical  ?  Comment: Vasectomy 04/04/2019  ?Other Topics Concern  ? Not on file  ?Social History Narrative  ? Fulltime: Careers information officer  ? Estate manager/land agent  ? ?Social Determinants of Health   ? ?Financial Resource Strain: Not on file  ?Food Insecurity: Not on file  ?Transportation Needs: Not on file  ?Physical Activity: Not on file  ?Stress: Not on file  ?Social Connections: Not on file  ?Intimate Partner Violence: Not on file  ? ? ?Review of Systems: ?See HPI, otherwise negative ROS ? ?Physical Exam: ?There were no vitals taken for this visit. ?General:   Alert,  pleasant and cooperative in NAD ?Head:  Normocephalic and atraumatic. ?Neck:  Supple; no masses or thyromegaly. ?Lungs:  Clear throughout to auscultation, normal respiratory effort.    ?Heart:  +S1, +S2, Regular rate and rhythm, No edema. ?Abdomen:  Soft, nontender and nondistended. Normal bowel sounds, without guarding, and without rebound.   ?Neurologic:  Alert and  oriented x4;  grossly normal neurologically. ? ?Impression/Plan: ?Joel Oconnor is here for an colonoscopy to be performed for Screening colonoscopy average risk   ?Risks, benefits, limitations, and alternatives regarding  colonoscopy have been reviewed with the patient.  Questions have been answered.  All parties agreeable. ? ? ?Jonathon Bellows, MD  09/17/2021, 9:56 AM ? ?

## 2021-09-17 NOTE — Anesthesia Preprocedure Evaluation (Signed)
Anesthesia Evaluation  ?Patient identified by MRN, date of birth, ID band ?Patient awake ? ? ? ?Reviewed: ?Allergy & Precautions, NPO status , Patient's Chart, lab work & pertinent test results ? ?History of Anesthesia Complications ?(+) PROLONGED EMERGENCE and history of anesthetic complications ? ?Airway ?Mallampati: III ? ?TM Distance: <3 FB ?Neck ROM: full ? ? ? Dental ? ?(+) Chipped ?  ?Pulmonary ?sleep apnea ,  ?  ?Pulmonary exam normal ? ? ? ? ? ? ? Cardiovascular ?Exercise Tolerance: Good ?(-) angina(-) Past MI negative cardio ROS ?Normal cardiovascular exam ? ? ?  ?Neuro/Psych ? Headaches, negative psych ROS  ? GI/Hepatic ?negative GI ROS, Neg liver ROS, neg GERD  ,  ?Endo/Other  ?negative endocrine ROS ? Renal/GU ?negative Renal ROS  ?negative genitourinary ?  ?Musculoskeletal ? ? Abdominal ?  ?Peds ? Hematology ?negative hematology ROS ?(+)   ?Anesthesia Other Findings ?Past Medical History: ?No date: Allergy ?No date: Headache ?No date: History of ADHD ?No date: Migraine ? ?Past Surgical History: ?No date: CYST EXCISION ?    Comment:  left index finger ?No date: FINGER FRACTURE SURGERY ?    Comment:  spiral fracture, right index finger-titanium screw in it ?No date: FRACTURE SURGERY ?04/04/2019: VASECTOMY ? ?BMI   ? Body Mass Index: 31.11 kg/m?  ?  ? ? Reproductive/Obstetrics ?negative OB ROS ? ?  ? ? ? ? ? ? ? ? ? ? ? ? ? ?  ?  ? ? ? ? ? ? ? ? ?Anesthesia Physical ?Anesthesia Plan ? ?ASA: 3 ? ?Anesthesia Plan: General  ? ?Post-op Pain Management:   ? ?Induction: Intravenous ? ?PONV Risk Score and Plan: Propofol infusion and TIVA ? ?Airway Management Planned: Natural Airway and Nasal Cannula ? ?Additional Equipment:  ? ?Intra-op Plan:  ? ?Post-operative Plan:  ? ?Informed Consent: I have reviewed the patients History and Physical, chart, labs and discussed the procedure including the risks, benefits and alternatives for the proposed anesthesia with the patient or  authorized representative who has indicated his/her understanding and acceptance.  ? ? ? ?Dental Advisory Given ? ?Plan Discussed with: Anesthesiologist, CRNA and Surgeon ? ?Anesthesia Plan Comments: (Patient consented for risks of anesthesia including but not limited to:  ?- adverse reactions to medications ?- risk of airway placement if required ?- damage to eyes, teeth, lips or other oral mucosa ?- nerve damage due to positioning  ?- sore throat or hoarseness ?- Damage to heart, brain, nerves, lungs, other parts of body or loss of life ? ?Patient voiced understanding.)  ? ? ? ? ? ? ?Anesthesia Quick Evaluation ? ?

## 2021-09-17 NOTE — Anesthesia Procedure Notes (Signed)
Procedure Name: [place] ?Date/Time: 09/17/2021 10:15 AM ?Performed by: Jerrye Noble, CRNA ?Pre-anesthesia Checklist: Patient identified, Emergency Drugs available, Suction available and Patient being monitored ?Patient Re-evaluated:Patient Re-evaluated prior to induction ?Oxygen Delivery Method: Nasal cannula ? ? ? ? ?

## 2021-09-17 NOTE — Transfer of Care (Signed)
Immediate Anesthesia Transfer of Care Note ? ?Patient: Joel Oconnor ? ?Procedure(s) Performed: COLONOSCOPY WITH PROPOFOL ? ?Patient Location: PACU and Endoscopy Unit ? ?Anesthesia Type:General ? ?Level of Consciousness: drowsy and patient cooperative ? ?Airway & Oxygen Therapy: Patient Spontanous Breathing ? ?Post-op Assessment: Report given to RN and Post -op Vital signs reviewed and stable ? ?Post vital signs: Reviewed and stable ? ?Last Vitals:  ?Vitals Value Taken Time  ?BP 105/86 09/17/21 1043  ?Temp    ?Pulse 66 09/17/21 1043  ?Resp 16 09/17/21 1043  ?SpO2 96 % 09/17/21 1043  ? ? ?Last Pain:  ?Vitals:  ? 09/17/21 1043  ?TempSrc:   ?PainSc: Asleep  ?   ? ?  ? ?Complications: No notable events documented. ?

## 2021-09-17 NOTE — Anesthesia Postprocedure Evaluation (Signed)
Anesthesia Post Note ? ?Patient: Joel Oconnor ? ?Procedure(s) Performed: COLONOSCOPY WITH PROPOFOL ? ?Patient location during evaluation: Endoscopy ?Anesthesia Type: General ?Level of consciousness: awake and alert ?Pain management: pain level controlled ?Vital Signs Assessment: post-procedure vital signs reviewed and stable ?Respiratory status: spontaneous breathing, nonlabored ventilation, respiratory function stable and patient connected to nasal cannula oxygen ?Cardiovascular status: blood pressure returned to baseline and stable ?Postop Assessment: no apparent nausea or vomiting ?Anesthetic complications: no ? ? ?No notable events documented. ? ? ?Last Vitals:  ?Vitals:  ? 09/17/21 1053 09/17/21 1103  ?BP: 110/74 108/69  ?Pulse: (!) 58 61  ?Resp: 15 13  ?Temp: (!) 35.6 ?C   ?SpO2: 96% 96%  ?  ?Last Pain:  ?Vitals:  ? 09/17/21 1103  ?TempSrc:   ?PainSc: 0-No pain  ? ? ?  ?  ?  ?  ?  ?  ? ?Joel Oconnor ? ? ? ? ?

## 2021-09-18 ENCOUNTER — Encounter: Payer: Self-pay | Admitting: Gastroenterology

## 2021-09-18 LAB — SURGICAL PATHOLOGY

## 2021-09-25 ENCOUNTER — Encounter: Payer: Self-pay | Admitting: Nurse Practitioner

## 2021-09-25 NOTE — Telephone Encounter (Signed)
Patient scheduled for 10/03/21 with Dr Lorelei Pont ?

## 2021-10-02 MED ORDER — TOPIRAMATE 25 MG PO TABS: 25.0000 mg | ORAL_TABLET | Freq: Every day | ORAL | 0 refills | Status: DC

## 2021-10-02 NOTE — Telephone Encounter (Signed)
1 month virtual visit is ok for margarine recheck ?

## 2021-10-03 ENCOUNTER — Encounter: Payer: Self-pay | Admitting: Family Medicine

## 2021-10-03 ENCOUNTER — Ambulatory Visit (INDEPENDENT_AMBULATORY_CARE_PROVIDER_SITE_OTHER): Payer: 59 | Admitting: Family Medicine

## 2021-10-03 VITALS — BP 106/74 | HR 59 | Temp 97.9°F | Ht 74.0 in | Wt 223.3 lb

## 2021-10-03 DIAGNOSIS — M7521 Bicipital tendinitis, right shoulder: Secondary | ICD-10-CM | POA: Diagnosis not present

## 2021-10-03 DIAGNOSIS — M7581 Other shoulder lesions, right shoulder: Secondary | ICD-10-CM

## 2021-10-03 DIAGNOSIS — M25511 Pain in right shoulder: Secondary | ICD-10-CM | POA: Diagnosis not present

## 2021-10-03 MED ORDER — TRIAMCINOLONE ACETONIDE 40 MG/ML IJ SUSP
40.0000 mg | Freq: Once | INTRAMUSCULAR | Status: AC
  Administered 2021-10-03: 40 mg via INTRA_ARTICULAR

## 2021-10-03 NOTE — Progress Notes (Signed)
Joel Oconnor T. Joel Illingworth, MD, [place] [facility] [place], [address]  Phone: [phone]  FAX: [phone]  [place] - 51 y.o. male  MRN [id number]  Date of Birth: March 09, 1971  Date: 10/03/2021  PCP: Michela Pitcher, NP  Referral: Michela Pitcher, NP  Chief Complaint  Patient presents with   Shoulder Pain    Right   Subjective:   Joel Oconnor is a 51 y.o. very pleasant male patient with Body mass index is 28.67 kg/m. who presents with the following:  R shoulder pain:  Off and on issues for one year.  Thinks that he did it while sleeping.   Wife did look at his shoulder - sounds like concern for a slap lesion. He is having some pain with terminal internal range of motion reaching across his body He also has some pain with flexion at the elbow and elevating his shoulder in the plane of abduction.  He has tried some basic things at home, but he has also been not using her shoulder for roughly a year.  He has not done much significant training for scapular stabilization and rotator cuff rehab.  No injury or trauma.  Felt a pop within the R shoulder within the last few days.   Has iced the shoulder some.  No heat. Some nsaids otc.  Review of Systems is noted in the HPI, as appropriate  Objective:   BP 106/74   Pulse (!) 59   Temp 97.9 F (36.6 C) (Oral)   Ht '6\' 2"'$  (1.88 m)   Wt 223 lb 5 oz (101.3 kg)   SpO2 98%   BMI 28.67 kg/m   GEN: No acute distress; alert,appropriate. PULM: Breathing comfortably in no respiratory distress PSYCH: Normally interactive.    Shoulder: Right Inspection: No muscle wasting or mild winging with abduction and resistance.  Ecchymosis/edema: neg  AC joint, scapula, clavicle: NT Cervical spine: NT, full ROM Spurling's: neg Abduction: full, 4++ /5 Flexion: full, 5/5 IR, full, lift-off: 5/5 ER at neutral: full, 5/5 AC crossover and compression: Mild positive Neer:  Positive Hawkins: Positive Drop Test: neg Empty Can: neg Supraspinatus insertion: NT Bicipital groove: Tender to palpation Speed's: Positive Yergason's: Positive Sulcus sign: neg C5-T1 intact Sensation intact Grip 5/5   Laboratory and Imaging Data: DG Shoulder Right  Result Date: 09/13/2021 CLINICAL DATA:  Chronic right shoulder pain. EXAM: RIGHT SHOULDER - 2+ VIEW COMPARISON:  None. FINDINGS: Normal bone mineralization. The glenohumeral joint space is maintained. Possible minimal peripheral acromioclavicular degenerative osteophytosis. No acute fracture is seen. No dislocation. The visualized portion of the right lung is unremarkable. IMPRESSION: Minimal acromioclavicular osteoarthritis. Electronically Signed   By: Yvonne Kendall M.D.   On: 09/13/2021 11:00     Assessment and Plan:     ICD-10-CM   1. Rotator cuff tendonitis, right  M75.81 Ambulatory referral to Physical Therapy    triamcinolone acetonide (KENALOG-40) injection 40 mg    2. Biceps tendonitis on right  M75.21 Ambulatory referral to Physical Therapy    3. Acute pain of right shoulder  M25.511 Ambulatory referral to Physical Therapy     Acute on chronic right shoulder pain with exacerbation.  He also has some biceps tendinopathy, I think the primary issue here is rotator cuff tendinopathy on the right.  He has some weakness predominantly in the parascapular musculature, as well as some scapular dyskinesis.  Think the training this region as well as his rotator  cuff would be of greatest benefit.  I will do a therapeutic injection of the subacromial space today to see if this provides relief as well as some formal physical therapy.  He can follow-up with me after he is completed his PT course.  Aspiration/Injection Procedure Note Joel Oconnor 16-May-1971 Date of procedure: 10/03/2021  Procedure: Large Joint Aspiration / Injection of Shoulder, Subacromial, R Indications: Pain  Procedure Details Verbal consent was  obtained from the patient. Risks, benefits, and alternatives were explained. Patient prepped with Chloraprep and Ethyl Chloride used for anesthesia. The subacromial space was injected using the posterior approach. The patient tolerated the procedure well and had decreased pain post injection. No complications. Injection: 9 cc of Lidocaine 1% and 1 mL of Kenalog 40 mg. Needle: 22 gauge Medication: 1 mL of Kenalog 40 mg   Medication Management during today's office visit: Meds ordered this encounter  Medications   triamcinolone acetonide (KENALOG-40) injection 40 mg   There are no discontinued medications.  Orders placed today for conditions managed today: Orders Placed This Encounter  Procedures   Ambulatory referral to Physical Therapy    Follow-up if needed: Return in about 6 weeks (around 11/14/2021) for Dr. Lorelei Pont, follow-up shoulder.  [employer] One speech-to-text software was used for transcription in this dictation.  Possible transcriptional errors can occur using Editor, commissioning.   Signed,  Maud Deed. Chun Sellen, MD   Outpatient Encounter Medications as of 10/03/2021  Medication Sig   aspirin-acetaminophen-caffeine (EXCEDRIN MIGRAINE) 250-250-65 MG tablet Take by mouth every 6 (six) hours as needed for headache.   topiramate (TOPAMAX) 25 MG tablet Take 1 tablet (25 mg total) by mouth at bedtime. (Patient not taking: Reported on 10/03/2021)   [EXPIRED] triamcinolone acetonide (KENALOG-40) injection 40 mg    No facility-administered encounter medications on file as of 10/03/2021.

## 2021-10-21 NOTE — Telephone Encounter (Signed)
Can we get him an appt please

## 2021-10-21 NOTE — Telephone Encounter (Signed)
Appointment made for 10/22/21

## 2021-10-22 ENCOUNTER — Encounter: Payer: Self-pay | Admitting: Nurse Practitioner

## 2021-10-22 ENCOUNTER — Ambulatory Visit (INDEPENDENT_AMBULATORY_CARE_PROVIDER_SITE_OTHER): Payer: 59 | Admitting: Nurse Practitioner

## 2021-10-22 VITALS — BP 134/78 | HR 56 | Temp 96.9°F | Resp 14 | Ht 74.0 in | Wt 215.5 lb

## 2021-10-22 DIAGNOSIS — R21 Rash and other nonspecific skin eruption: Secondary | ICD-10-CM | POA: Diagnosis not present

## 2021-10-22 DIAGNOSIS — L853 Xerosis cutis: Secondary | ICD-10-CM | POA: Diagnosis not present

## 2021-10-22 DIAGNOSIS — R519 Headache, unspecified: Secondary | ICD-10-CM

## 2021-10-22 MED ORDER — TRIAMCINOLONE ACETONIDE 0.1 % EX CREA: 1.0000 "application " | TOPICAL_CREAM | Freq: Two times a day (BID) | CUTANEOUS | 0 refills | Status: AC

## 2021-10-22 MED ORDER — NORTRIPTYLINE HCL 10 MG PO CAPS: 10.0000 mg | ORAL_CAPSULE | Freq: Every day | ORAL | 0 refills | Status: DC

## 2021-10-22 NOTE — Progress Notes (Signed)
Note  Established Patient Office Visit  Subjective   Patient ID: Joel Oconnor, male    DOB: 03/02/1971  Age: 51 y.o. MRN: [id number]  Chief Complaint  Patient presents with   Rash    Rash on both hands -Friday night 10/18/21 itchiness present near a bug bite looking area in the left wrist area by Sunday looked like poison ivy break out-hives break out. Used Benadryl, cortisone cream.      Rash: Noticed the rash o Friday. States it itched and thought it was a bug bite. States Sunday he looked down and it looked like poision ivy but did not have global itching. States that's that he did use benadryl and coritisone. Has not changed any hand products.  Has been using lotion on the hands as he says they feel drier than normal.  No more than normal hands in water time  [place]. No others in the house has it the rash. States that the benadryl and cortisone cream, unsure if they helped  Topiramate: states that he has been on the medication for approx 1 month. States that he feels like he is having less sleep. States that he can go to sleep but wake up. Unsure if it is the medication. States that he has had less intense migraines and not sure if it has helped with frequency. But thinks a decrease in low grade headaches.    Review of Systems  Skin:  Positive for itching and rash.  Neurological:  Positive for headaches.  Psychiatric/Behavioral:  The patient has insomnia.      Objective:     BP 134/78   Pulse (!) 56   Temp (!) 96.9 F (36.1 C)   Resp 14   Ht '6\' 2"'$  (1.88 m)   Wt 215 lb 8 oz (97.8 kg)   SpO2 95%   BMI 27.67 kg/m    Physical Exam Vitals and nursing note reviewed.  Constitutional:      Appearance: Normal appearance.  Skin:    General: Skin is warm.     Findings: Lesion and rash present.          Comments: Several small macular places with hardened scab like in the middle. No tracking marks  Neurological:     Mental  Status: He is alert.     No results found for any visits on 10/22/21.    The 10-year ASCVD risk score (Arnett DK, et al., 2019) is: 5.8%    Assessment & Plan:   Problem List Items Addressed This Visit       Musculoskeletal and Integument   Rash - Primary    Ambiguous in nature.  Looks like it is on the mend and healing.  Consider scabies if no improvement with triamcinolone cream.  Precautions reviewed with using steroid cream       Relevant Medications   triamcinolone cream (KENALOG) 0.1 %   Dry skin    H encourage patient to use over-the-counter Vaseline to Bilateral hands and cover with gloves or socks at night to restore hydration to hands.         Other   Frequent headaches    Trialed him on topiramate 25 mg nightly.  Patient states he is having the side effect of insomnia states he will be down for an hour or 2 and then be up and have difficulty returning to sleep.  He did note that the medication was beneficial in reducing  quantity and quality of headaches.  After joint discussion we will stop topiramate 25 mg and start nortriptyline 10 mg nightly.  Follow-up 1 month virtually for medication recheck       Relevant Medications   nortriptyline (PAMELOR) 10 MG capsule    Return in about 4 weeks (around 11/19/2021) for virtual visit for medication recheck.    Romilda Garret, NP

## 2021-10-22 NOTE — Assessment & Plan Note (Signed)
Trialed him on topiramate 25 mg nightly.  Patient states he is having the side effect of insomnia states he will be down for an hour or 2 and then be up and have difficulty returning to sleep.  He did note that the medication was beneficial in reducing quantity and quality of headaches.  After joint discussion we will stop topiramate 25 mg and start nortriptyline 10 mg nightly.  Follow-up 1 month virtually for medication recheck

## 2021-10-22 NOTE — Assessment & Plan Note (Signed)
H encourage patient to use over-the-counter Vaseline to Bilateral hands and cover with gloves or socks at night to restore hydration to hands.

## 2021-10-22 NOTE — Assessment & Plan Note (Addendum)
Ambiguous in nature.  Looks like it is on the mend and healing.  Consider scabies if no improvement with triamcinolone cream.  Precautions reviewed with using steroid cream

## 2021-10-22 NOTE — Patient Instructions (Signed)
Nice to see you today If the rash does not continue to get better let me know We are going to stop the topiramate and start the nortriptyline. Schedule a month virtual visit for a recheck on you and the medication

## 2021-10-23 ENCOUNTER — Ambulatory Visit (INDEPENDENT_AMBULATORY_CARE_PROVIDER_SITE_OTHER): Payer: 59 | Admitting: Physical Therapy

## 2021-10-23 ENCOUNTER — Encounter: Payer: Self-pay | Admitting: Physical Therapy

## 2021-10-23 ENCOUNTER — Other Ambulatory Visit: Payer: Self-pay

## 2021-10-23 DIAGNOSIS — M6281 Muscle weakness (generalized): Secondary | ICD-10-CM | POA: Diagnosis not present

## 2021-10-23 DIAGNOSIS — M25511 Pain in right shoulder: Secondary | ICD-10-CM

## 2021-10-23 DIAGNOSIS — M25611 Stiffness of right shoulder, not elsewhere classified: Secondary | ICD-10-CM

## 2021-10-23 NOTE — Therapy (Signed)
OUTPATIENT PHYSICAL THERAPY SHOULDER EVALUATION   Patient Name: Joel Oconnor MRN: [id number] DOB:Aug 19, 1970, 51 y.o., male Today's Date: 10/23/2021   PT End of Session - 10/23/21 0921     Visit Number 1    Number of Visits 5    Date for PT Re-Evaluation 11/27/21    Authorization Type U[employer]    Authorization Time Period 20% coinsurance, 60 visits PT/OT/chiro    PT Start Time 0838    PT Stop Time 0925    PT Time Calculation (min) 47 min    Activity Tolerance Patient tolerated treatment well             Past Medical History:  Diagnosis Date   Allergy    Headache    History of ADHD    Migraine    Past Surgical History:  Procedure Laterality Date   COLONOSCOPY WITH PROPOFOL N/A 09/17/2021   Procedure: COLONOSCOPY WITH PROPOFOL;  Surgeon: Jonathon Bellows, MD;  Location: [facility] ENDOSCOPY;  Service: Gastroenterology;  Laterality: N/A;   CYST EXCISION     left index finger   FINGER FRACTURE SURGERY     spiral fracture, right index finger-titanium screw in it   FRACTURE SURGERY     VASECTOMY  04/04/2019   Patient Active Problem List   Diagnosis Date Noted   Rash 10/22/2021   Dry skin 10/22/2021   Frequent headaches 10/22/2021   Overweight 09/12/2021   Preventative health care 09/12/2021   Chronic right shoulder pain 09/12/2021    PCP: Michela Pitcher, NP  REFERRING PROVIDER: Owens Loffler, MD  REFERRING DIAG:  M75.21 (ICD-10-CM) - Biceps tendonitis on right  M75.81 (ICD-10-CM) - Rotator cuff tendonitis, right  M25.511 (ICD-10-CM) - Acute pain of right shoulder    THERAPY DIAG:  Acute pain of right shoulder  Muscle weakness (generalized)  Stiffness of right shoulder, not elsewhere classified  Rationale for Evaluation and Treatment Rehabilitation  ONSET DATE: 10/03/2021  MD referral to PT  SUBJECTIVE:                                                                                                                                                                                       SUBJECTIVE STATEMENT: This 51yo had onset of right shoulder pain over year ago with no trauma or injury noted. He may have tore or over stretched muscle reaching back.  He had injection of shoulder on 10/03/2021. Pain has improved.  PT referral for "Evaluate and Treat, Modalities of Choice, 1 times a week for 4 weeks, Please assist with a program for rotator cuff strengthening and scapular stablization exercises. In addition to working with during therapy, please help the patient  with a home exercise program."   PERTINENT HISTORY: Migraines, ADHD  PAIN:  Are you having pain? Yes: NPRS scale: 0/10 in last week 2/10 Pain location: anterior right shoulder Pain description: initially sharp then burning Aggravating factors: certain movements Relieving factors: stop using it for 1-2 hours, calms down in ~67mn, ice  PRECAUTIONS: None  WEIGHT BEARING RESTRICTIONS No  FALLS:  Has patient fallen in last 6 months? No  OCCUPATION: Computer program; he has standing adjustable desk & floor mat  PLOF: Independent, Leisure: video games  PATIENT GOALS  be able to exercise including push up, use arm without pain & no conscious thought to how using shoulder  OBJECTIVE:   DIAGNOSTIC FINDINGS:  10/03/2021 Normal bone mineralization. The glenohumeral joint space is maintained. Possible minimal peripheral acromioclavicular degenerative osteophytosis. No acute fracture is seen. No dislocation. The visualized portion of the right lung is unremarkable. IMPRESSION: Minimal acromioclavicular osteoarthritis  PATIENT SURVEYS:  FOTO 10/23/2021: 62%  target 72%  COGNITION: Overall cognitive status: Within functional limits for tasks assessed     SENSATION: WFL  POSTURE: 10/23/2021:  Rounded shoulder, head forward ~2"  UPPER EXTREMITY ROM:   ROM P:passive  A:active Right Eval 10/23/21 Left eval  Shoulder flexion P: 141* A: 135*   Shoulder extension    Shoulder abduction P: 171* A: 168*    Shoulder adduction    Shoulder internal rotation Supine P: 72* A: 63*   Shoulder external rotation Supine P: 71* A: 63*   Elbow flexion    Elbow extension    Wrist flexion    Wrist extension    Wrist ulnar deviation    Wrist radial deviation    Wrist pronation    Wrist supination    (Blank rows = not tested)  UPPER EXTREMITY MMT:  MMT Right Eval 10/23/21 Left eval  Shoulder flexion 4/5   Shoulder extension 3+/5   Shoulder abduction 4/5   Shoulder adduction 4/5   Shoulder internal rotation 3+/5 pain   Shoulder external rotation 3+/5 pain   Middle trapezius    Lower trapezius    Elbow flexion    Elbow extension    Wrist flexion    Wrist extension    Wrist ulnar deviation    Wrist radial deviation    Wrist pronation    Wrist supination    Grip strength (lbs)    (Blank rows = not tested)  SHOULDER SPECIAL TESTS:  SLAP lesions: Biceps load test: negative  Rotator cuff assessment: Drop arm test: negative and Empty can test: negative  JOINT MOBILITY TESTING:  10/23/21:  No laxity noted in glenohumeral joint but crepitus with eccentric anti-gravity motions. 10/23/21:  Mild winging of right scapula with RUE motions.   PALPATION:  10/23/21:  No tenderness during eval but reports that he has some tenderness anteriorly (points to pectoralis & biceps tendon)   TODAY'S TREATMENT:  Therex:         HEP instruction/performance c cues for techniques, handout provided.  Trial set performed of each for comprehension and symptom assessment.  See below for exercise list.    PATIENT EDUCATION: Education details: Access Code: [email or web address] educated: Patient Education method: Explanation, Demonstration, Verbal cues, and Handouts Education comprehension: verbalized understanding, returned demonstration, and needs further education   HOME EXERCISE PROGRAM: Access Code: [email or web address]: [email or web address] Date: 10/23/2021 Prepared by: RJamey Reas Exercises -  Supine Chin Tuck  - 1 x daily - 7 x weekly - 2-3 sets - 10 reps - 5 seconds  hold - Seated Cervical Retraction  - 1 x daily - 7 x weekly - 2-3 sets - 10 reps - 5 seconds hold - Supine Scapular Retraction  - 1 x daily - 7 x weekly - 2-3 sets - 10 reps - 5 seconds hold - Seated Scapular Retraction  - 1 x daily - 7 x weekly - 2-3 sets - 10 reps - 5 seconds hold - Standing Shoulder Flexion with Resistance  - 1 x daily - 7 x weekly - 2-3 sets - 10 reps - 5 seconds hold - Standing Single Arm Shoulder Abduction with Resistance  - 1 x daily - 7 x weekly - 2-3 sets - 10 reps - 5 seconds hold - Shoulder External Rotation with Anchored Resistance  - 1 x daily - 7 x weekly - 2-3 sets - 10 reps - 5 seconds hold - Standing Shoulder Internal Rotation with Anchored Resistance  - 1 x daily - 7 x weekly - 2-3 sets - 10 reps - 5 seconds hold - Single Arm Shoulder Extension with Anchored Resistance  - 1 x daily - 7 x weekly - 2-3 sets - 10 reps - 5 seconds hold  ASSESSMENT:  CLINICAL IMPRESSION: Patient is a 51 y.o. male who was seen today for physical therapy evaluation and treatment for right shoulder pain & tendonitis. Patient has limitations in function with dominant arm due to shoulder pain, weakness and limitations in AROM. Patient would benefit from skilled PT with instruction in progressive exercise program and posture / body mechanics.   OBJECTIVE IMPAIRMENTS decreased knowledge of condition, decreased ROM, decreased strength, impaired UE functional use, postural dysfunction, and pain.   ACTIVITY LIMITATIONS carrying, lifting, and reach over head  PARTICIPATION LIMITATIONS: occupation  PERSONAL FACTORS 1 comorbidity: see PMH  are also affecting patient's functional outcome.   REHAB POTENTIAL: Good  CLINICAL DECISION MAKING: Stable/uncomplicated  EVALUATION COMPLEXITY: Low   GOALS: Goals reviewed with patient? Yes  LONG TERM GOALS: Target date:  11/27/2021     Patient self reports improved  function with FOTO 72% Baseline: see objective data Goal status: INITIAL  2.  Patient reports no pain in right shoulder with functional activities.  Baseline: see objective data Goal status: INITIAL  3.  Patient verbalizes & demonstrates understanding of ongoing HEP.  Baseline: see objective data Goal status: INITIAL  4.  Right shoulder AROM WNL Baseline: see objective data Goal status: INITIAL  5.  Right shoulder strength WNL Baseline: see objective data Goal status: INITIAL   PLAN: PT FREQUENCY: 1x/week  PT DURATION:  5 sessions  PLANNED INTERVENTIONS: Therapeutic exercises, Therapeutic activity, Neuromuscular re-education, Patient/Family education, Joint mobilization, Dry Needling, Electrical stimulation, Cryotherapy, Moist heat, Taping, Ionotophoresis '4mg'$ /ml Dexamethasone, Manual therapy, and physical performance testing  PLAN FOR NEXT SESSION: check & update HEP   Jamey Reas, PT, DPT 10/23/2021, 11:07 AM

## 2021-10-25 ENCOUNTER — Other Ambulatory Visit: Payer: Self-pay | Admitting: Nurse Practitioner

## 2021-10-30 ENCOUNTER — Ambulatory Visit (INDEPENDENT_AMBULATORY_CARE_PROVIDER_SITE_OTHER): Payer: 59 | Admitting: Physical Therapy

## 2021-10-30 ENCOUNTER — Encounter: Payer: Self-pay | Admitting: Physical Therapy

## 2021-10-30 DIAGNOSIS — M25511 Pain in right shoulder: Secondary | ICD-10-CM

## 2021-10-30 DIAGNOSIS — M6281 Muscle weakness (generalized): Secondary | ICD-10-CM | POA: Diagnosis not present

## 2021-10-30 DIAGNOSIS — M25611 Stiffness of right shoulder, not elsewhere classified: Secondary | ICD-10-CM | POA: Diagnosis not present

## 2021-10-30 NOTE — Therapy (Signed)
OUTPATIENT PHYSICAL THERAPY TREATMENT NOTE   Patient Name: Joel Oconnor MRN: [id number] DOB:29-May-1970, 51 y.o., male Today's Date: 10/30/2021  PCP: Michela Pitcher, NP REFERRING PROVIDER: Owens Loffler, MD  END OF SESSION:   PT End of Session - 10/30/21 0842     Visit Number 2    Number of Visits 5    Date for PT Re-Evaluation 11/27/21    Authorization Type UHC    Authorization Time Period 20% coinsurance, 60 visits PT/OT/chiro    PT Start Time [phone]    Activity Tolerance Patient tolerated treatment well             Past Medical History:  Diagnosis Date   Allergy    Headache    History of ADHD    Migraine    Past Surgical History:  Procedure Laterality Date   COLONOSCOPY WITH PROPOFOL N/A 09/17/2021   Procedure: COLONOSCOPY WITH PROPOFOL;  Surgeon: Jonathon Bellows, MD;  Location: [facility] ENDOSCOPY;  Service: Gastroenterology;  Laterality: N/A;   CYST EXCISION     left index finger   FINGER FRACTURE SURGERY     spiral fracture, right index finger-titanium screw in it   FRACTURE SURGERY     VASECTOMY  04/04/2019   Patient Active Problem List   Diagnosis Date Noted   Rash 10/22/2021   Dry skin 10/22/2021   Frequent headaches 10/22/2021   Overweight 09/12/2021   Preventative health care 09/12/2021   Chronic right shoulder pain 09/12/2021    REFERRING DIAG:  M75.21 (ICD-10-CM) - Biceps tendonitis on right  M75.81 (ICD-10-CM) - Rotator cuff tendonitis, right  M25.511 (ICD-10-CM) - Acute pain of right shoulder   ONSET DATE: 10/03/2021  MD referral to PT  THERAPY DIAG:  Acute pain of right shoulder  Muscle weakness (generalized)  Stiffness of right shoulder, not elsewhere classified  Rationale for Evaluation and Treatment Rehabilitation  PERTINENT HISTORY: Migraines, ADHD  PRECAUTIONS: None  SUBJECTIVE: He has been doing exercises and check posture at work.  He woke up with hands behind his head which is his comfortable position with no symptoms.   PAIN:   Are you having pain? No He gets a burn with exercises but not really pain.    OBJECTIVE: (objective measures completed at initial evaluation unless otherwise dated)  OBJECTIVE:    DIAGNOSTIC FINDINGS:  10/03/2021 Normal bone mineralization. The glenohumeral joint space is maintained. Possible minimal peripheral acromioclavicular degenerative osteophytosis. No acute fracture is seen. No dislocation. The visualized portion of the right lung is unremarkable. IMPRESSION: Minimal acromioclavicular osteoarthritis   PATIENT SURVEYS:  FOTO 10/23/2021: 62%  target 72%   COGNITION: Overall cognitive status: Within functional limits for tasks assessed                                     SENSATION: WFL   POSTURE: 10/23/2021:  Rounded shoulder, head forward ~2"   UPPER EXTREMITY ROM:    ROM P:passive  A:active Right Eval 10/23/21 Left eval  Shoulder flexion P: 141* A: 135*    Shoulder extension      Shoulder abduction P: 171* A: 168*    Shoulder adduction      Shoulder internal rotation Supine P: 72* A: 63*    Shoulder external rotation Supine P: 71* A: 63*    Elbow flexion      Elbow extension      Wrist flexion      Wrist  extension      Wrist ulnar deviation      Wrist radial deviation      Wrist pronation      Wrist supination      (Blank rows = not tested)   UPPER EXTREMITY MMT:   MMT Right Eval 10/23/21 Left eval  Shoulder flexion 4/5    Shoulder extension 3+/5    Shoulder abduction 4/5    Shoulder adduction 4/5    Shoulder internal rotation 3+/5 pain    Shoulder external rotation 3+/5 pain    Middle trapezius      Lower trapezius      Elbow flexion      Elbow extension      Wrist flexion      Wrist extension      Wrist ulnar deviation      Wrist radial deviation      Wrist pronation      Wrist supination      Grip strength (lbs)      (Blank rows = not tested)   SHOULDER SPECIAL TESTS:             SLAP lesions: Biceps load test: negative              Rotator cuff assessment: Drop arm test: negative and Empty can test: negative   JOINT MOBILITY TESTING:  10/23/21:  No laxity noted in glenohumeral joint but crepitus with eccentric anti-gravity motions. 10/23/21:  Mild winging of right scapula with RUE motions.    PALPATION:  10/23/21:  No tenderness during eval but reports that he has some tenderness anteriorly (points to pectoralis & biceps tendon)              TODAY'S TREATMENT:  10/30/2021: Therapeutic Exercise:   Access Code: [email or web address] Exercises Updated to include Push Up on Table  - 1 x daily - 7 x weekly - 2-3 sets - 10 reps - 5 seconds hold - Doorway Pec Stretch at 90 Degrees Abduction  - 1 x daily - 7 x weekly - 1 sets - 1 reps - 20-30 seconds hold - Doorway Pec Stretch at 60 Degrees Abduction with Arm Straight  - 1 x daily - 7 x weekly - 1 sets - 1 reps - 20-30 seconds hold - Doorway Pec Stretch at 120 Degrees Abduction  - 1 x daily - 7 x weekly - 1 sets - 1 reps - 20-30 seconds hold  Progressed to green from red theraband pt performed 2 sets of 10 reps of each. PT noted some muscle fatigue at end of each set of 10. Pt reported no pain but felt muscle trembling. Patient had good form with exercises.  PT instructed to progress green theraband from 2 sets of 10 reps to 3 sets. Once he notes minimal muscle fatigue then switch to blue theraband.  With switch he can go back to 2 sets until this does not fatigue then back to 3 sets.  Next progression is 2 sets of 15 reps. Pt verbalized understanding. - Standing Single Arm Shoulder Flexion with Resistance  2 sets - 10 reps - 5 seconds hold Standing Single Arm Shoulder Abduction with Resistance  -2 sets - 10 reps - 5 seconds hold - Shoulder External Rotation with Anchored Resistance  - 1 x daily - 7 x weekly - 2 sets - 10 reps - 5 seconds hold - Standing Shoulder Internal Rotation with Anchored Resistance  - 2 sets - 10 reps - 5 seconds hold -  Single Arm Shoulder Extension with Anchored  Resistance  - 2 sets - 10 reps - 5 seconds hold   10/23/2021:  Therex:         HEP instruction/performance c cues for techniques, handout provided.  Trial set performed of each for comprehension and symptom assessment.  See below for exercise list.      PATIENT EDUCATION: Education details: Access Code: [email or web address] Person educated: Patient Education method: Explanation, Demonstration, Verbal cues, and Handouts Education comprehension: verbalized understanding, returned demonstration, and needs further education     HOME EXERCISE PROGRAM: Access Code: [email or web address] URL: [email or web address] Date: 10/23/2021 Prepared by: Jamey Reas   Exercises - Supine Chin Tuck  - 1 x daily - 7 x weekly - 2-3 sets - 10 reps - 5 seconds hold - Seated Cervical Retraction  - 1 x daily - 7 x weekly - 2-3 sets - 10 reps - 5 seconds hold - Supine Scapular Retraction  - 1 x daily - 7 x weekly - 2-3 sets - 10 reps - 5 seconds hold - Seated Scapular Retraction  - 1 x daily - 7 x weekly - 2-3 sets - 10 reps - 5 seconds hold - Standing Shoulder Flexion with Resistance  - 1 x daily - 7 x weekly - 2-3 sets - 10 reps - 5 seconds hold - Standing Single Arm Shoulder Abduction with Resistance  - 1 x daily - 7 x weekly - 2-3 sets - 10 reps - 5 seconds hold - Shoulder External Rotation with Anchored Resistance  - 1 x daily - 7 x weekly - 2-3 sets - 10 reps - 5 seconds hold - Standing Shoulder Internal Rotation with Anchored Resistance  - 1 x daily - 7 x weekly - 2-3 sets - 10 reps - 5 seconds hold - Single Arm Shoulder Extension with Anchored Resistance  - 1 x daily - 7 x weekly - 2-3 sets - 10 reps - 5 seconds hold   ASSESSMENT:   CLINICAL IMPRESSION: Patient's shoulder appears to have improved with noted no pain.  PT added push up on counter & 3 way pectoralis stretch which he seems to understand.  PT progressed theraband resistance with instruction in sets to improve muscle endurance and amount of resistance.      OBJECTIVE IMPAIRMENTS decreased knowledge of condition, decreased ROM, decreased strength, impaired UE functional use, postural dysfunction, and pain.    ACTIVITY LIMITATIONS carrying, lifting, and reach over head   PARTICIPATION LIMITATIONS: occupation   PERSONAL FACTORS 1 comorbidity: see PMH  are also affecting patient's functional outcome.    REHAB POTENTIAL: Good   CLINICAL DECISION MAKING: Stable/uncomplicated   EVALUATION COMPLEXITY: Low     GOALS: Goals reviewed with patient? Yes   LONG TERM GOALS: Target date:  11/27/2021      Patient self reports improved function with FOTO 72% Baseline: see objective data Goal status: INITIAL   2.  Patient reports no pain in right shoulder with functional activities.  Baseline: see objective data Goal status: INITIAL   3.  Patient verbalizes & demonstrates understanding of ongoing HEP.  Baseline: see objective data Goal status: INITIAL   4.  Right shoulder AROM WNL Baseline: see objective data Goal status: INITIAL   5.  Right shoulder strength WNL Baseline: see objective data Goal status: INITIAL     PLAN: PT FREQUENCY: 1x/week   PT DURATION:  5 sessions   PLANNED INTERVENTIONS: Therapeutic exercises, Therapeutic activity, Neuromuscular re-education, Patient/Family education, Joint mobilization, Dry Needling, Electrical  stimulation, Cryotherapy, Moist heat, Taping, Ionotophoresis '4mg'$ /ml Dexamethasone, Manual therapy, and physical performance testing   PLAN FOR NEXT SESSION: see back in 2 weeks to check progress & possible dc   Jamey Reas, PT, DPT 10/30/2021, 9:18 AM

## 2021-11-05 ENCOUNTER — Ambulatory Visit (INDEPENDENT_AMBULATORY_CARE_PROVIDER_SITE_OTHER): Payer: 59 | Admitting: Nurse Practitioner

## 2021-11-05 ENCOUNTER — Encounter: Payer: Self-pay | Admitting: Nurse Practitioner

## 2021-11-05 VITALS — BP 128/70 | HR 72 | Temp 97.0°F | Resp 14 | Ht 74.0 in | Wt 215.2 lb

## 2021-11-05 DIAGNOSIS — R519 Headache, unspecified: Secondary | ICD-10-CM

## 2021-11-05 MED ORDER — NORTRIPTYLINE HCL 10 MG PO CAPS: 10.0000 mg | ORAL_CAPSULE | Freq: Every day | ORAL | 2 refills | Status: DC
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
## 2021-11-05 NOTE — Patient Instructions (Addendum)
Nice to see you today Follow up  as needed I also want to see you in May 2024 for your next physical   We can cancel the 11/28/2021 video visit
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
## 2021-11-06 ENCOUNTER — Encounter: Payer: 59 | Admitting: Physical Therapy

## 2021-11-13 ENCOUNTER — Encounter: Payer: Self-pay | Admitting: Physical Therapy

## 2021-11-13 ENCOUNTER — Ambulatory Visit (INDEPENDENT_AMBULATORY_CARE_PROVIDER_SITE_OTHER): Payer: 59 | Admitting: Physical Therapy

## 2021-11-13 DIAGNOSIS — M25511 Pain in right shoulder: Secondary | ICD-10-CM | POA: Diagnosis not present

## 2021-11-13 DIAGNOSIS — M6281 Muscle weakness (generalized): Secondary | ICD-10-CM | POA: Diagnosis not present

## 2021-11-13 DIAGNOSIS — M25611 Stiffness of right shoulder, not elsewhere classified: Secondary | ICD-10-CM

## 2021-11-13 NOTE — Therapy (Signed)
OUTPATIENT PHYSICAL THERAPY TREATMENT NOTE & DISCHARGE SUMMARY   Patient Name: Joel Oconnor MRN: [id number] DOB:08-08-1970, 51 y.o., male Today's Date: 11/13/2021  PCP: Michela Pitcher, NP REFERRING PROVIDER: Owens Loffler, MD  PHYSICAL THERAPY DISCHARGE SUMMARY  Visits from Start of Care: 3  Current functional level related to goals / functional outcomes: See below   Remaining deficits: See below   Education / Equipment: HEP pt understands.    Patient agrees to discharge. Patient goals were met. Patient is being discharged due to meeting the stated rehab goals.   END OF SESSION:   PT End of Session - 11/13/21 0846     Visit Number 3    Number of Visits 5    Date for PT Re-Evaluation 11/27/21    Authorization Type [employer]    Authorization Time Period 20% coinsurance, 60 visits PT/OT/chiro    PT Start Time 0845    PT Stop Time 0901    PT Time Calculation (min) 16 min    Activity Tolerance Patient tolerated treatment well              Past Medical History:  Diagnosis Date   Allergy    Headache    History of ADHD    Migraine    Past Surgical History:  Procedure Laterality Date   COLONOSCOPY WITH PROPOFOL N/A 09/17/2021   Procedure: COLONOSCOPY WITH PROPOFOL;  Surgeon: Jonathon Bellows, MD;  Location: [facility] ENDOSCOPY;  Service: Gastroenterology;  Laterality: N/A;   CYST EXCISION     left index finger   FINGER FRACTURE SURGERY     spiral fracture, right index finger-titanium screw in it   FRACTURE SURGERY     VASECTOMY  04/04/2019   Patient Active Problem List   Diagnosis Date Noted   Rash 10/22/2021   Dry skin 10/22/2021   Frequent headaches 10/22/2021   Overweight 09/12/2021   Preventative health care 09/12/2021   Chronic right shoulder pain 09/12/2021    REFERRING DIAG:  M75.21 (ICD-10-CM) - Biceps tendonitis on right  M75.81 (ICD-10-CM) - Rotator cuff tendonitis, right  M25.511 (ICD-10-CM) - Acute pain of right shoulder   ONSET DATE: 10/03/2021  MD  referral to PT  THERAPY DIAG:  Acute pain of right shoulder  Muscle weakness (generalized)  Stiffness of right shoulder, not elsewhere classified  Rationale for Evaluation and Treatment Rehabilitation  PERTINENT HISTORY: Migraines, ADHD  PRECAUTIONS: None  SUBJECTIVE: He started blue theraband yesterday and little soreness. At work he feels shoulder if sits longer. He noticed posture had decline and issue resolved when he corrects posture. No issues with any functional activities at home, work or otherwise.   PAIN:  Are you having pain?  No with blue band he gets a burn near end of each exercise but not really pain. The burn with green lasted 2 days for ~20 min.     OBJECTIVE: (objective measures completed at initial evaluation unless otherwise dated)  OBJECTIVE:    DIAGNOSTIC FINDINGS:  10/03/2021 Normal bone mineralization. The glenohumeral joint space is maintained. Possible minimal peripheral acromioclavicular degenerative osteophytosis. No acute fracture is seen. No dislocation. The visualized portion of the right lung is unremarkable. IMPRESSION: Minimal acromioclavicular osteoarthritis   PATIENT SURVEYS:  FOTO 10/23/2021: 62%  target 72%  11/13/2021:  82%   COGNITION: Overall cognitive status: Within functional limits for tasks assessed  SENSATION: WFL   POSTURE: 10/23/2021:  Rounded shoulder, head forward ~2"   UPPER EXTREMITY ROM:    ROM P:passive  A:active Right Eval 10/23/21 Left eval Right 11/13/21  Shoulder flexion P: 141* A: 135*   A: 153*  Shoulder extension       Shoulder abduction P: 171* A: 168*   A: 168*  Shoulder adduction       Shoulder internal rotation Supine P: 72* A: 63*   Supine A: 89*  Shoulder external rotation Supine P: 71* A: 63*   Supine A:84*  Elbow flexion       Elbow extension       Wrist flexion       Wrist extension       Wrist ulnar deviation       Wrist radial deviation       Wrist  pronation       Wrist supination       (Blank rows = not tested)   UPPER EXTREMITY MMT:   MMT Right Eval 10/23/21 Left eval Right 6/28/223  Shoulder flexion 4/5   5/5  Shoulder extension 3+/5   5/5  Shoulder abduction 4/5   5/5  Shoulder adduction 4/5   5/5  Shoulder internal rotation 3+/5 pain   5/5 No pain  Shoulder external rotation 3+/5 pain   5/5 No pain  Middle trapezius       Lower trapezius       Elbow flexion       Elbow extension       Wrist flexion       Wrist extension       Wrist ulnar deviation       Wrist radial deviation       Wrist pronation       Wrist supination       Grip strength (lbs)       (Blank rows = not tested)   SHOULDER SPECIAL TESTS:             SLAP lesions: Biceps load test: negative             Rotator cuff assessment: Drop arm test: negative and Empty can test: negative   JOINT MOBILITY TESTING:  10/23/21:  No laxity noted in glenohumeral joint but crepitus with eccentric anti-gravity motions. 10/23/21:  Mild winging of right scapula with RUE motions.    PALPATION:  10/23/21:  No tenderness during eval but reports that he has some tenderness anteriorly (points to pectoralis & biceps tendon)              TODAY'S TREATMENT:  11/13/2021 See range & strength objective data.  All performed with no pain.   10/30/2021: Therapeutic Exercise:   Access Code: [email or web address] Exercises Updated to include Push Up on Table  - 1 x daily - 7 x weekly - 2-3 sets - 10 reps - 5 seconds hold - Doorway Pec Stretch at 90 Degrees Abduction  - 1 x daily - 7 x weekly - 1 sets - 1 reps - 20-30 seconds hold - Doorway Pec Stretch at 60 Degrees Abduction with Arm Straight  - 1 x daily - 7 x weekly - 1 sets - 1 reps - 20-30 seconds hold - Doorway Pec Stretch at 120 Degrees Abduction  - 1 x daily - 7 x weekly - 1 sets - 1 reps - 20-30 seconds hold  Progressed to green from red theraband pt performed 2 sets of 10 reps of each. PT  noted some muscle fatigue at end of each  set of 10. Pt reported no pain but felt muscle trembling. Patient had good form with exercises.  PT instructed to progress green theraband from 2 sets of 10 reps to 3 sets. Once he notes minimal muscle fatigue then switch to blue theraband.  With switch he can go back to 2 sets until this does not fatigue then back to 3 sets.  Next progression is 2 sets of 15 reps. Pt verbalized understanding. - Standing Single Arm Shoulder Flexion with Resistance  2 sets - 10 reps - 5 seconds hold Standing Single Arm Shoulder Abduction with Resistance  -2 sets - 10 reps - 5 seconds hold - Shoulder External Rotation with Anchored Resistance  - 1 x daily - 7 x weekly - 2 sets - 10 reps - 5 seconds hold - Standing Shoulder Internal Rotation with Anchored Resistance  - 2 sets - 10 reps - 5 seconds hold - Single Arm Shoulder Extension with Anchored Resistance  - 2 sets - 10 reps - 5 seconds hold   10/23/2021:  Therex:         HEP instruction/performance c cues for techniques, handout provided.  Trial set performed of each for comprehension and symptom assessment.  See below for exercise list.      PATIENT EDUCATION: Education details: Access Code: [email or web address] Person educated: Patient Education method: Explanation, Demonstration, Verbal cues, and Handouts Education comprehension: verbalized understanding, returned demonstration, and needs further education     HOME EXERCISE PROGRAM: Access Code: [email or web address] URL: [email or web address] Date: 10/23/2021 Prepared by: Jamey Reas   Exercises - Supine Chin Tuck  - 1 x daily - 7 x weekly - 2-3 sets - 10 reps - 5 seconds hold - Seated Cervical Retraction  - 1 x daily - 7 x weekly - 2-3 sets - 10 reps - 5 seconds hold - Supine Scapular Retraction  - 1 x daily - 7 x weekly - 2-3 sets - 10 reps - 5 seconds hold - Seated Scapular Retraction  - 1 x daily - 7 x weekly - 2-3 sets - 10 reps - 5 seconds hold - Standing Shoulder Flexion with Resistance  - 1 x daily -  7 x weekly - 2-3 sets - 10 reps - 5 seconds hold - Standing Single Arm Shoulder Abduction with Resistance  - 1 x daily - 7 x weekly - 2-3 sets - 10 reps - 5 seconds hold - Shoulder External Rotation with Anchored Resistance  - 1 x daily - 7 x weekly - 2-3 sets - 10 reps - 5 seconds hold - Standing Shoulder Internal Rotation with Anchored Resistance  - 1 x daily - 7 x weekly - 2-3 sets - 10 reps - 5 seconds hold - Single Arm Shoulder Extension with Anchored Resistance  - 1 x daily - 7 x weekly - 2-3 sets - 10 reps - 5 seconds hold   ASSESSMENT:   CLINICAL IMPRESSION: Patient met all LTGs.  He appears to be functioning without pain or issues. His dominant right shoulder has full range & strength.    OBJECTIVE IMPAIRMENTS decreased knowledge of condition, decreased ROM, decreased strength, impaired UE functional use, postural dysfunction, and pain.    ACTIVITY LIMITATIONS carrying, lifting, and reach over head   PARTICIPATION LIMITATIONS: occupation   PERSONAL FACTORS 1 comorbidity: see PMH  are also affecting patient's functional outcome.    REHAB POTENTIAL: Good   CLINICAL DECISION MAKING: Stable/uncomplicated   EVALUATION COMPLEXITY: Low  GOALS: Goals reviewed with patient? Yes   LONG TERM GOALS: Target date:  11/27/2021      Patient self reports improved function with FOTO 72% Baseline: see objective data Goal status: MET 11/13/2021   2.  Patient reports no pain in right shoulder with functional activities.  Baseline: see subjective Goal status: MET 11/13/2021   3.  Patient verbalizes & demonstrates understanding of ongoing HEP.  Baseline: see objective data Goal status: MET 11/13/2021   4.  Right shoulder AROM WNL Baseline: see objective data Goal status: MET 11/13/2021   5.  Right shoulder strength WNL Baseline: see objective data Goal status: MET 11/13/2021     PLAN: PT FREQUENCY: 1x/week   PT DURATION:  5 sessions   PLANNED INTERVENTIONS: Therapeutic  exercises, Therapeutic activity, Neuromuscular re-education, Patient/Family education, Joint mobilization, Dry Needling, Electrical stimulation, Cryotherapy, Moist heat, Taping, Ionotophoresis 51m/ml Dexamethasone, Manual therapy, and physical performance testing   PLAN FOR NEXT SESSION: discharge PT   RJamey Reas PT, DPT 11/13/2021, 9:08 AM

## 2021-11-14 ENCOUNTER — Ambulatory Visit (INDEPENDENT_AMBULATORY_CARE_PROVIDER_SITE_OTHER): Payer: 59 | Admitting: Family Medicine

## 2021-11-14 ENCOUNTER — Encounter: Payer: Self-pay | Admitting: Family Medicine

## 2021-11-14 VITALS — BP 100/72 | HR 64 | Temp 97.8°F | Ht 74.0 in | Wt 215.2 lb

## 2021-11-14 DIAGNOSIS — M7581 Other shoulder lesions, right shoulder: Secondary | ICD-10-CM

## 2021-11-14 NOTE — Progress Notes (Signed)
Joel Chatterjee T. [name], MD, Joel Oconnor, [id number]  Phone: [phone]  FAX: [phone]  Joel Oconnor - 51 y.o. male  MRN [id number]  Date of Birth: 01/20/1971  Date: 11/14/2021  PCP: Michela Pitcher, NP  Referral: Michela Pitcher, NP  Chief Complaint  Patient presents with   Follow-up    Right Shoulder   Subjective:   Joel Oconnor is a 51 y.o. very pleasant male patient with Body mass index is 27.63 kg/m. who presents with the following:  6 week f/u, and the last time I saw him, I felt like he had some RTC tendonitis.  I did a subac injection and sent him to formal PT.  At this point he is mostly doing better and he is not having a painful arc of motion.  Range of motion is full and he is having minimal to no symptoms with abduction and internal range of motion.  He has been diligent about doing physical therapy and home rehab.  He feels much stronger, and he understands that he is developed some weakness and is secondary musculature to stabilize the shoulder and scapular stabilizers.  Relatively sedentary in general.  10/03/2021 Last OV with Owens Loffler, MD  Off and on issues for one year.  Thinks that he did it while sleeping.   Wife did look at his shoulder - sounds like concern for a slap lesion. He is having some pain with terminal internal range of motion reaching across his body He also has some pain with flexion at the elbow and elevating his shoulder in the plane of abduction.  He has tried some basic things at home, but he has also been not using her shoulder for roughly a year.  He has not done much significant training for scapular stabilization and rotator cuff rehab.   No injury or trauma.   Felt a pop within the R shoulder within the last few days.    Has iced the shoulder some.  No heat. Some nsaids otc.  Review of Systems is noted in the HPI, as appropriate  Objective:    BP 100/72   Pulse 64   Temp 97.8 F (36.6 C) (Oral)   Ht '6\' 2"'$  (1.88 m)   Wt 215 lb 3 oz (97.6 kg)   SpO2 98%   BMI 27.63 kg/m   GEN: No acute distress; alert,appropriate. PULM: Breathing comfortably in no respiratory distress PSYCH: Normally interactive.    Shoulder: R Inspection: No muscle wasting or winging Ecchymosis/edema: neg  AC joint, scapula, clavicle: NT Cervical spine: NT, full ROM Spurling's: neg Abduction: full, 5/5 Flexion: full, 5/5 IR, full, lift-off: 5/5 ER at neutral: full, 5/5 AC crossover and compression: neg Neer: neg Hawkins: neg Drop Test: neg Empty Can: neg Supraspinatus insertion: NT Bicipital groove: NT Speed's: neg Yergason's: neg Sulcus sign: neg Scapular dyskinesis: none C5-T1 intact Sensation intact Grip 5/5   Laboratory and Imaging Data:  Assessment and Plan:     ICD-10-CM   1. Rotator cuff tendonitis, right  M75.81      Doing much better.  While he is not 100% he has made great strides and is not having much by way of impingement symptoms.  He will continue physical therapy, home rehab.  He can follow-up with me as needed only.  [employer] One speech-to-text software was used for transcription in this dictation.  Possible transcriptional errors can occur using Editor, commissioning.  Signed,  Maud Deed. Nalah Macioce, MD   Outpatient Encounter Medications as of 11/14/2021  Medication Sig   aspirin-acetaminophen-caffeine (EXCEDRIN MIGRAINE) 250-250-65 MG tablet Take by mouth every 6 (six) hours as needed for headache.   nortriptyline (PAMELOR) 10 MG capsule Take 1 capsule (10 mg total) by mouth at bedtime.   triamcinolone cream (KENALOG) 0.1 % Apply 1 application. topically 2 (two) times daily.   No facility-administered encounter medications on file as of 11/14/2021.

## 2021-11-27 ENCOUNTER — Encounter: Payer: 59 | Admitting: Physical Therapy

## 2021-11-28 ENCOUNTER — Telehealth: Payer: 59 | Admitting: Nurse Practitioner

## 2022-03-06 ENCOUNTER — Other Ambulatory Visit (INDEPENDENT_AMBULATORY_CARE_PROVIDER_SITE_OTHER): Payer: 59

## 2022-03-06 DIAGNOSIS — E7800 Pure hypercholesterolemia, unspecified: Secondary | ICD-10-CM

## 2022-03-06 LAB — LIPID PANEL
Cholesterol: 276 mg/dL — ABNORMAL HIGH (ref 0–200)
HDL: 44.4 mg/dL (ref >39.00)
LDL Cholesterol: 201 mg/dL — ABNORMAL HIGH (ref 0–99)
NonHDL: 231.18
Total CHOL/HDL Ratio: 6
Triglycerides: 149 mg/dL (ref 0.0–149.0)
VLDL: 29.8 mg/dL (ref 0.0–40.0)

## 2022-03-10 ENCOUNTER — Encounter: Payer: Self-pay | Admitting: Nurse Practitioner

## 2022-03-10 DIAGNOSIS — E7800 Pure hypercholesterolemia, unspecified: Secondary | ICD-10-CM

## 2022-03-10 MED ORDER — ROSUVASTATIN CALCIUM 10 MG PO TABS: 10.0000 mg | ORAL_TABLET | Freq: Every day | ORAL | 1 refills | Status: DC

## 2022-03-10 NOTE — Telephone Encounter (Signed)
Patient needs a FASTING 3 month lab appointment. Orders are placed

## 2022-03-10 NOTE — Telephone Encounter (Signed)
Patient has been scheduled

## 2022-06-10 ENCOUNTER — Other Ambulatory Visit (INDEPENDENT_AMBULATORY_CARE_PROVIDER_SITE_OTHER): Payer: 59

## 2022-06-10 DIAGNOSIS — E7800 Pure hypercholesterolemia, unspecified: Secondary | ICD-10-CM | POA: Diagnosis not present

## 2022-06-10 LAB — HEPATIC FUNCTION PANEL
ALT: 38 U/L (ref 0–53)
AST: 22 U/L (ref 0–37)
Albumin: 4.7 g/dL (ref 3.5–5.2)
Alkaline Phosphatase: 77 U/L (ref 39–117)
Bilirubin, Direct: 0.1 mg/dL (ref 0.0–0.3)
Total Bilirubin: 0.5 mg/dL (ref 0.2–1.2)
Total Protein: 7.2 g/dL (ref 6.0–8.3)

## 2022-06-10 LAB — LIPID PANEL
Cholesterol: 135 mg/dL (ref 0–200)
HDL: 45.7 mg/dL (ref >39.00)
LDL Cholesterol: 69 mg/dL (ref 0–99)
NonHDL: 89.16
Total CHOL/HDL Ratio: 3
Triglycerides: 102 mg/dL (ref 0.0–149.0)
VLDL: 20.4 mg/dL (ref 0.0–40.0)

## 2022-08-18 ENCOUNTER — Other Ambulatory Visit: Payer: Self-pay | Admitting: Nurse Practitioner

## 2022-08-18 DIAGNOSIS — R519 Headache, unspecified: Secondary | ICD-10-CM

## 2022-09-03 ENCOUNTER — Other Ambulatory Visit: Payer: Self-pay | Admitting: Nurse Practitioner

## 2022-09-03 DIAGNOSIS — E7800 Pure hypercholesterolemia, unspecified: Secondary | ICD-10-CM

## 2022-09-29 ENCOUNTER — Other Ambulatory Visit: Payer: Self-pay | Admitting: Nurse Practitioner

## 2022-09-29 DIAGNOSIS — E7800 Pure hypercholesterolemia, unspecified: Secondary | ICD-10-CM

## 2022-10-07 ENCOUNTER — Encounter: Payer: Self-pay | Admitting: Nurse Practitioner

## 2022-10-07 ENCOUNTER — Ambulatory Visit (INDEPENDENT_AMBULATORY_CARE_PROVIDER_SITE_OTHER): Payer: 59 | Admitting: Nurse Practitioner

## 2022-10-07 VITALS — BP 122/78 | HR 72 | Temp 98.3°F | Resp 16 | Ht 71.0 in | Wt 218.4 lb

## 2022-10-07 DIAGNOSIS — Z0000 Encounter for general adult medical examination without abnormal findings: Secondary | ICD-10-CM

## 2022-10-07 DIAGNOSIS — E669 Obesity, unspecified: Secondary | ICD-10-CM | POA: Diagnosis not present

## 2022-10-07 DIAGNOSIS — E7800 Pure hypercholesterolemia, unspecified: Secondary | ICD-10-CM | POA: Diagnosis not present

## 2022-10-07 DIAGNOSIS — M25511 Pain in right shoulder: Secondary | ICD-10-CM

## 2022-10-07 DIAGNOSIS — R519 Headache, unspecified: Secondary | ICD-10-CM

## 2022-10-07 DIAGNOSIS — G8929 Other chronic pain: Secondary | ICD-10-CM

## 2022-10-07 DIAGNOSIS — Z125 Encounter for screening for malignant neoplasm of prostate: Secondary | ICD-10-CM

## 2022-10-07 LAB — CBC
HCT: 46.5 % (ref 39.0–52.0)
Hemoglobin: 15.6 g/dL (ref 13.0–17.0)
MCHC: 33.6 g/dL (ref 30.0–36.0)
MCV: 83.6 fl (ref 78.0–100.0)
Platelets: 347 10*3/uL (ref 150.0–400.0)
RBC: 5.56 Mil/uL (ref 4.22–5.81)
RDW: 13.8 % (ref 11.5–15.5)
WBC: 5.2 10*3/uL (ref 4.0–10.5)

## 2022-10-07 LAB — COMPREHENSIVE METABOLIC PANEL
ALT: 42 U/L (ref 0–53)
AST: 27 U/L (ref 0–37)
Albumin: 4.7 g/dL (ref 3.5–5.2)
Alkaline Phosphatase: 69 U/L (ref 39–117)
BUN: 15 mg/dL (ref 6–23)
CO2: 28 mEq/L (ref 19–32)
Calcium: 9.1 mg/dL (ref 8.4–10.5)
Chloride: 103 mEq/L (ref 96–112)
Creatinine, Ser: 0.95 mg/dL (ref 0.40–1.50)
GFR: 93.09 mL/min (ref >60.00)
Glucose, Bld: 94 mg/dL (ref 70–99)
Potassium: 4.3 mEq/L (ref 3.5–5.1)
Sodium: 139 mEq/L (ref 135–145)
Total Bilirubin: 0.5 mg/dL (ref 0.2–1.2)
Total Protein: 6.8 g/dL (ref 6.0–8.3)

## 2022-10-07 LAB — TSH: TSH: 3.66 u[IU]/mL (ref 0.35–5.50)

## 2022-10-07 LAB — HEMOGLOBIN A1C: Hgb A1c MFr Bld: 5.6 % (ref 4.6–6.5)

## 2022-10-07 LAB — PSA: PSA: 0.54 ng/mL (ref 0.10–4.00)

## 2022-10-07 NOTE — Progress Notes (Signed)
Established Patient Office Visit  Subjective   Patient ID: Joel Oconnor, male    DOB: 12-06-1970  Age: 52 y.o. MRN: [id number]  Chief Complaint  Patient presents with   Annual Exam    HPI  for complete physical and follow up of chronic conditions.  Headaches: states that he is on the nortriptyline and has been having increased headaches as of late. States that he did start a new role with work. States looser sugar intake and weather changes. One headache a week fo rthe past 2 months. States  that he has to use the excedrin twice, with benefit  Right shoulder: states that he is still doing the exercises but still having difficulty and pain.  Patient is curious if he could benefit from an MRI.  He has been followed by Hannah Beat, MD.  Immunizations: -Tetanus: Completed in 2023 -Influenza: refused  -Shingles: Information discussed in office. -Pneumonia: Too young - covid: pfizer  Diet: Fair diet. 2 meals a day. States that he will have a few snacks through the day. He will drink water and coffee Exercise: No regular exercise.   Eye exam: reading glasses. Due for his yearly  Dental exam: Needs updating  Colonoscopy: Completed in, repeat in 2030 Lung Cancer Screening: N/A  PSA: Due  Sleep: depneds 1030-1 for bed time. States that he gets up around 7-730. Feels rested some days. Does snore.       Review of Systems  Constitutional:  Negative for chills and fever.  Respiratory:  Negative for shortness of breath.   Cardiovascular:  Negative for chest pain and leg swelling.  Gastrointestinal:  Negative for abdominal pain, blood in stool, constipation, diarrhea, nausea and vomiting.       BM daily   Genitourinary:  Negative for dysuria and hematuria.  Neurological:  Positive for headaches. Negative for tingling.  Psychiatric/Behavioral:  Negative for hallucinations and suicidal ideas.       Objective:     BP 122/78   Pulse 72   Temp 98.3 F (36.8 C)   Resp 16    Ht 5\' 11"  (1.803 m)   Wt 218 lb 6 oz (99.1 kg)   SpO2 96%   BMI 30.46 kg/m  BP Readings from Last 3 Encounters:  10/07/22 122/78  11/14/21 100/72  11/05/21 128/70   Wt Readings from Last 3 Encounters:  10/07/22 218 lb 6 oz (99.1 kg)  11/14/21 215 lb 3 oz (97.6 kg)  11/05/21 215 lb 4 oz (97.6 kg)      Physical Exam Vitals and nursing note reviewed.  Constitutional:      Appearance: Normal appearance.  HENT:     Right Ear: Tympanic membrane, ear canal and external ear normal.     Left Ear: Tympanic membrane, ear canal and external ear normal.     Mouth/Throat:     Mouth: Mucous membranes are moist.     Pharynx: Oropharynx is clear.  Eyes:     Extraocular Movements: Extraocular movements intact.     Pupils: Pupils are equal, round, and reactive to light.  Cardiovascular:     Rate and Rhythm: Normal rate and regular rhythm.     Pulses: Normal pulses.     Heart sounds: Normal heart sounds.  Pulmonary:     Effort: Pulmonary effort is normal.     Breath sounds: Normal breath sounds.  Abdominal:     General: Bowel sounds are normal. There is no distension.     Palpations: There is no  mass.     Tenderness: There is no abdominal tenderness.     Hernia: No hernia is present.  Genitourinary:    Comments: deferred Musculoskeletal:     Right lower leg: No edema.     Left lower leg: No edema.  Lymphadenopathy:     Cervical: No cervical adenopathy.  Skin:    General: Skin is warm.  Neurological:     General: No focal deficit present.     Mental Status: He is alert.     Deep Tendon Reflexes:     Reflex Scores:      Bicep reflexes are 2+ on the right side and 2+ on the left side.      Patellar reflexes are 2+ on the right side and 2+ on the left side.    Comments: Bilateral upper and lower extremity strength 5/5  Psychiatric:        Mood and Affect: Mood normal.        Behavior: Behavior normal.        Thought Content: Thought content normal.        Judgment: Judgment  normal.      No results found for any visits on 10/07/22.    The 10-year ASCVD risk score (Arnett DK, et al., 2019) is: 2%    Assessment & Plan:   Problem List Items Addressed This Visit       Other   Preventative health care - Primary    Discussed age-appropriate immunizations and screening exams.  Did review patient's personal, surgical, social, family histories.  Patient up-to-date on all age-appropriate vaccines that he wants.  Patient is interested in the shingles vaccine but will get it at a later date as he is going to go out of town this weekend.  Patient is up-to-date on CRC screening will do PSA for prostate cancer screening today.  Patient was given information at discharge about preventative healthcare maintenance with anticipatory guidance.      Relevant Orders   CBC   Comprehensive metabolic panel   TSH   Chronic right shoulder pain    Patient with a history of the same has been evaluated by sports medicine.  Recommend follow-up with sports medicine as he still having issues.  For further evaluation      Frequent headaches    History of the same was well-controlled on nortriptyline 10 mg nightly.  Patient states as of late has increased but he does not think it is multifactorial given a slip in his dietary habits, new role at work, and increased storms or weather related to the area.  Continue nortriptyline 10 mg nightly.  Continue Excedrin Migraine as needed.  If he needs an abortive therapy this prescription he will reach out let me know we can consider a triptan at that juncture.      Obesity (BMI 30-39.9)    Patient has mentioned that he is at his diet sleep as of late.  Continue work on lifestyle modifications pending TSH and A1c today.      Relevant Orders   Hemoglobin A1c   Hypercholesteremia    Patient currently maintained on rosuvastatin 10 mg.  Patient tolerates medication well.  Had a nice reduction in lipids recently got it checked at work LDL in the  70s per patient report.  Continue medication as prescribed      Relevant Orders   Hemoglobin A1c   Other Visit Diagnoses     Screening for prostate cancer  Relevant Orders   PSA       Return in about 1 year (around 10/07/2023) for CPE and Labs.    Audria Nine, NP

## 2022-10-07 NOTE — Assessment & Plan Note (Signed)
Patient has mentioned that he is at his diet sleep as of late.  Continue work on lifestyle modifications pending TSH and A1c today.

## 2022-10-07 NOTE — Assessment & Plan Note (Signed)
Patient with a history of the same has been evaluated by sports medicine.  Recommend follow-up with sports medicine as he still having issues.  For further evaluation

## 2022-10-07 NOTE — Patient Instructions (Signed)
Nice to see you today I will be in touch with the labs once I review them Follow up with me in 1 year, sooner if you need me  Consider getting the Shingrix vaccine this year

## 2022-10-07 NOTE — Assessment & Plan Note (Signed)
History of the same was well-controlled on nortriptyline 10 mg nightly.  Patient states as of late has increased but he does not think it is multifactorial given a slip in his dietary habits, new role at work, and increased storms or weather related to the area.  Continue nortriptyline 10 mg nightly.  Continue Excedrin Migraine as needed.  If he needs an abortive therapy this prescription he will reach out let me know we can consider a triptan at that juncture.

## 2022-10-07 NOTE — Assessment & Plan Note (Signed)
Patient currently maintained on rosuvastatin 10 mg.  Patient tolerates medication well.  Had a nice reduction in lipids recently got it checked at work LDL in the 70s per patient report.  Continue medication as prescribed

## 2022-10-07 NOTE — Assessment & Plan Note (Signed)
Discussed age-appropriate immunizations and screening exams.  Did review patient's personal, surgical, social, family histories.  Patient up-to-date on all age-appropriate vaccines that he wants.  Patient is interested in the shingles vaccine but will get it at a later date as he is going to go out of town this weekend.  Patient is up-to-date on CRC screening will do PSA for prostate cancer screening today.  Patient was given information at discharge about preventative healthcare maintenance with anticipatory guidance.

## 2022-10-28 ENCOUNTER — Other Ambulatory Visit: Payer: Self-pay | Admitting: Nurse Practitioner

## 2022-10-28 DIAGNOSIS — E7800 Pure hypercholesterolemia, unspecified: Secondary | ICD-10-CM

## 2022-11-18 ENCOUNTER — Other Ambulatory Visit: Payer: Self-pay | Admitting: Nurse Practitioner

## 2022-11-18 DIAGNOSIS — R519 Headache, unspecified: Secondary | ICD-10-CM

## 2023-04-15 ENCOUNTER — Other Ambulatory Visit: Payer: Self-pay | Admitting: Medical Genetics

## 2023-04-16 ENCOUNTER — Encounter: Payer: Self-pay | Admitting: Nurse Practitioner

## 2023-04-22 ENCOUNTER — Other Ambulatory Visit: Payer: Self-pay | Admitting: Medical Genetics

## 2023-04-29 ENCOUNTER — Other Ambulatory Visit: Payer: Self-pay | Admitting: Nurse Practitioner

## 2023-04-29 DIAGNOSIS — E7800 Pure hypercholesterolemia, unspecified: Secondary | ICD-10-CM

## 2023-05-22 ENCOUNTER — Other Ambulatory Visit (HOSPITAL_COMMUNITY): Payer: 59

## 2023-05-26 ENCOUNTER — Other Ambulatory Visit (HOSPITAL_COMMUNITY)
Admission: RE | Admit: 2023-05-26 | Discharge: 2023-05-26 | Disposition: A | Discharge status: Home / Self Care | Payer: Self-pay | Source: Ambulatory Visit | Attending: Medical Genetics | Admitting: Medical Genetics

## 2023-06-05 LAB — GENECONNECT MOLECULAR SCREEN: Genetic Analysis Overall Interpretation: NEGATIVE

## 2023-06-08 ENCOUNTER — Other Ambulatory Visit: Payer: Self-pay | Admitting: Nurse Practitioner

## 2023-06-08 DIAGNOSIS — R519 Headache, unspecified: Secondary | ICD-10-CM

## 2023-06-08 NOTE — Telephone Encounter (Signed)
Can we scheduled patient for a CPE according to the last office note please

## 2023-06-09 NOTE — Telephone Encounter (Signed)
lvm for pt to call office to schedule appt.  

## 2023-08-05 ENCOUNTER — Encounter: Payer: Self-pay | Admitting: Nurse Practitioner

## 2023-08-05 DIAGNOSIS — R519 Headache, unspecified: Secondary | ICD-10-CM

## 2023-08-05 MED ORDER — SUMATRIPTAN SUCCINATE 50 MG PO TABS: 50.0000 mg | ORAL_TABLET | ORAL | 0 refills | Status: DC | PRN

## 2023-11-12 ENCOUNTER — Other Ambulatory Visit: Payer: Self-pay | Admitting: Nurse Practitioner

## 2023-11-12 DIAGNOSIS — E7800 Pure hypercholesterolemia, unspecified: Secondary | ICD-10-CM

## 2023-11-12 NOTE — Telephone Encounter (Signed)
 Needs CPE within 30 days to continue getting refills. Any slot is ok to use

## 2023-11-16 NOTE — Telephone Encounter (Signed)
 Spoke to pt, sch cpe for 12/07/23

## 2023-12-07 ENCOUNTER — Encounter: Payer: Self-pay | Admitting: Nurse Practitioner

## 2023-12-07 ENCOUNTER — Ambulatory Visit (INDEPENDENT_AMBULATORY_CARE_PROVIDER_SITE_OTHER): Admitting: Nurse Practitioner

## 2023-12-07 VITALS — BP 128/82 | HR 56 | Temp 97.8°F | Ht 71.0 in | Wt 221.6 lb

## 2023-12-07 DIAGNOSIS — E669 Obesity, unspecified: Secondary | ICD-10-CM | POA: Diagnosis not present

## 2023-12-07 DIAGNOSIS — Z23 Encounter for immunization: Secondary | ICD-10-CM | POA: Diagnosis not present

## 2023-12-07 DIAGNOSIS — Z0000 Encounter for general adult medical examination without abnormal findings: Secondary | ICD-10-CM

## 2023-12-07 DIAGNOSIS — E7800 Pure hypercholesterolemia, unspecified: Secondary | ICD-10-CM | POA: Diagnosis not present

## 2023-12-07 DIAGNOSIS — Z114 Encounter for screening for human immunodeficiency virus [HIV]: Secondary | ICD-10-CM

## 2023-12-07 DIAGNOSIS — R519 Headache, unspecified: Secondary | ICD-10-CM | POA: Diagnosis not present

## 2023-12-07 DIAGNOSIS — Z125 Encounter for screening for malignant neoplasm of prostate: Secondary | ICD-10-CM | POA: Diagnosis not present

## 2023-12-07 DIAGNOSIS — Z1159 Encounter for screening for other viral diseases: Secondary | ICD-10-CM

## 2023-12-07 LAB — CBC WITH DIFFERENTIAL/PLATELET
Basophils Absolute: 0 K/uL (ref 0.0–0.1)
Basophils Relative: 0.4 % (ref 0.0–3.0)
Eosinophils Absolute: 0 K/uL (ref 0.0–0.7)
Eosinophils Relative: 0.6 % (ref 0.0–5.0)
HCT: 47.9 % (ref 39.0–52.0)
Hemoglobin: 15.8 g/dL (ref 13.0–17.0)
Lymphocytes Relative: 32 % (ref 12.0–46.0)
Lymphs Abs: 1.6 K/uL (ref 0.7–4.0)
MCHC: 33 g/dL (ref 30.0–36.0)
MCV: 84.5 fl (ref 78.0–100.0)
Monocytes Absolute: 0.5 K/uL (ref 0.1–1.0)
Monocytes Relative: 9.1 % (ref 3.0–12.0)
Neutro Abs: 3 K/uL (ref 1.4–7.7)
Neutrophils Relative %: 57.9 % (ref 43.0–77.0)
Platelets: 321 K/uL (ref 150.0–400.0)
RBC: 5.67 Mil/uL (ref 4.22–5.81)
RDW: 13.7 % (ref 11.5–15.5)
WBC: 5.1 K/uL (ref 4.0–10.5)

## 2023-12-07 LAB — COMPREHENSIVE METABOLIC PANEL WITH GFR
ALT: 41 U/L (ref 0–53)
AST: 25 U/L (ref 0–37)
Albumin: 5.1 g/dL (ref 3.5–5.2)
Alkaline Phosphatase: 61 U/L (ref 39–117)
BUN: 15 mg/dL (ref 6–23)
CO2: 26 meq/L (ref 19–32)
Calcium: 9.3 mg/dL (ref 8.4–10.5)
Chloride: 104 meq/L (ref 96–112)
Creatinine, Ser: 0.96 mg/dL (ref 0.40–1.50)
GFR: 91.18 mL/min (ref >60.00)
Glucose, Bld: 89 mg/dL (ref 70–99)
Potassium: 4.5 meq/L (ref 3.5–5.1)
Sodium: 137 meq/L (ref 135–145)
Total Bilirubin: 0.8 mg/dL (ref 0.2–1.2)
Total Protein: 7.1 g/dL (ref 6.0–8.3)

## 2023-12-07 LAB — LIPID PANEL
Cholesterol: 158 mg/dL (ref 0–200)
HDL: 47.1 mg/dL (ref >39.00)
LDL Cholesterol: 87 mg/dL (ref 0–99)
NonHDL: 110.88
Total CHOL/HDL Ratio: 3
Triglycerides: 120 mg/dL (ref 0.0–149.0)
VLDL: 24 mg/dL (ref 0.0–40.0)

## 2023-12-07 LAB — PSA: PSA: 0.5 ng/mL (ref 0.10–4.00)

## 2023-12-07 LAB — TSH: TSH: 3.27 u[IU]/mL (ref 0.35–5.50)

## 2023-12-07 LAB — HEMOGLOBIN A1C: Hgb A1c MFr Bld: 5.8 % (ref 4.6–6.5)

## 2023-12-07 MED ORDER — NORTRIPTYLINE HCL 10 MG PO CAPS: 20.0000 mg | ORAL_CAPSULE | Freq: Every day | ORAL | 0 refills | Status: DC

## 2023-12-07 MED ORDER — SUMATRIPTAN SUCCINATE 50 MG PO TABS: 50.0000 mg | ORAL_TABLET | ORAL | 1 refills | Status: DC | PRN

## 2023-12-07 NOTE — Patient Instructions (Signed)
 Nice to see you today I increased the nortriptyline  to 20mg  a night Follow up with me in 3 months, sooner if you need me

## 2023-12-07 NOTE — Assessment & Plan Note (Signed)
 Discussed age-appropriate immunizations and screening exams.  Did review patient's personal, surgical, social histories.  Patient up-to-date on all age-appropriate vaccinations would like.  Administer first shingles vaccine in office today.  Patient is up-to-date on CRC screening.  PSA for prostate cancer screening today.  Patient was given information at discharge about preventative healthcare maintenance with anticipatory guidance.

## 2023-12-07 NOTE — Progress Notes (Signed)
 Established Patient Office Visit  Subjective   Patient ID: Joel Oconnor, male    DOB: 1970/12/28  Age: 53 y.o. MRN: [id number]  Chief Complaint  Patient presents with   Annual Exam    Pt would like shingles vaccine.    HPI  Frequent headaches: Patient currently maintained on nortriptyline  10 mg nightly along with sumatriptan  50 mg as needed. States that he has been using the imitrex  as the weather. States that over the past 6 months he has been having them more frequently. State sthat he is having stress at work.   HLD: Patient currently maintained on rosuvastatin  10 mg daily  for complete physical and follow up of chronic conditions.  Immunizations: -Tetanus: Completed in 2023 -Influenza: Out of season -Shingles: First dose today -Pneumonia: Too young -COVID: Original series  Diet: Fair diet. He is eating 2 meals a day. He has gained some weight back. 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  Eating out for lunch. He is drinking coffee and water.  Exercise:  He has been doing shoulder exercises for this shoulder. Bands an push ups   Eye exam: readers.  Is followed by eye doctor Dental exam: needs updating      Colonoscopy: Completed in 09-17-21, repeat 7 years.  Patient is due in 2030 Lung Cancer Screening: nA   PSA: Due  Sleep: goin gto bed around 03-1229 and will get up 730-8. Sometimes he feels rested.      12/07/2023   10:48 AM 10/07/2022    9:34 AM 09/12/2021    2:12 PM  PHQ9 SCORE ONLY  PHQ-9 Total Score 7 6 7        12/07/2023   10:48 AM 10/07/2022    9:35 AM 09/12/2021    2:13 PM  GAD 7 : Generalized Anxiety Score  Nervous, Anxious, on Edge 0 0 1  Control/stop worrying 1 0 1  Worry too much - different things 1 1 1   Trouble relaxing 1 1 0  Restless 0 1 2  Easily annoyed or irritable 1 1 1   Afraid - awful might happen 0 0 0  Total GAD 7 Score 4 4 6   Anxiety Difficulty Not difficult at all Somewhat difficult Somewhat difficult           Review of Systems   Constitutional:  Negative for chills and fever.  Respiratory:  Negative for shortness of breath.   Cardiovascular:  Negative for chest pain and leg swelling.  Gastrointestinal:  Negative for abdominal pain, blood in stool, constipation, diarrhea, nausea and vomiting.       Bm daily   Genitourinary:  Negative for dysuria and hematuria.  Neurological:  Positive for headaches. Negative for tingling.  Psychiatric/Behavioral:  Negative for hallucinations and suicidal ideas.       Objective:     BP 128/82   Pulse (!) 56   Temp 97.8 F (36.6 C) (Oral)   Ht 5' 11 (1.803 m)   Wt 221 lb 9.6 oz (100.5 kg)   SpO2 98%   BMI 30.91 kg/m  BP Readings from Last 3 Encounters:  12/07/23 128/82  10/07/22 122/78  11/14/21 100/72   Wt Readings from Last 3 Encounters:  12/07/23 221 lb 9.6 oz (100.5 kg)  10/07/22 218 lb 6 oz (99.1 kg)  11/14/21 215 lb 3 oz (97.6 kg)   SpO2 Readings from Last 3 Encounters:  12/07/23 98%  10/07/22 96%  11/14/21 98%      Physical Exam Vitals and nursing note  reviewed.  Constitutional:      Appearance: Normal appearance.  HENT:     Right Ear: Tympanic membrane, ear canal and external ear normal.     Left Ear: Tympanic membrane, ear canal and external ear normal.     Mouth/Throat:     Mouth: Mucous membranes are moist.     Pharynx: Oropharynx is clear.  Eyes:     Extraocular Movements: Extraocular movements intact.     Pupils: Pupils are equal, round, and reactive to light.  Cardiovascular:     Rate and Rhythm: Normal rate and regular rhythm.     Pulses: Normal pulses.     Heart sounds: Normal heart sounds.  Pulmonary:     Effort: Pulmonary effort is normal.     Breath sounds: Normal breath sounds.  Abdominal:     General: Bowel sounds are normal. There is no distension.     Palpations: There is no mass.     Tenderness: There is no abdominal tenderness.     Hernia: No hernia is present.  Genitourinary:    Comments: deferred Musculoskeletal:      Right lower leg: No edema.     Left lower leg: No edema.  Lymphadenopathy:     Cervical: No cervical adenopathy.  Skin:    General: Skin is warm.  Neurological:     General: No focal deficit present.     Mental Status: He is alert.     Deep Tendon Reflexes:     Reflex Scores:      Bicep reflexes are 2+ on the right side and 2+ on the left side.      Patellar reflexes are 2+ on the right side and 2+ on the left side.    Comments: Bilateral upper and lower extremity strength 5/5  Psychiatric:        Mood and Affect: Mood normal.        Behavior: Behavior normal.        Thought Content: Thought content normal.        Judgment: Judgment normal.      No results found for any visits on 12/07/23.    The 10-year ASCVD risk score (Arnett DK, et al., 2019) is: 2.8%    Assessment & Plan:   Problem List Items Addressed This Visit       Other   Preventative health care - Primary   Discussed age-appropriate immunizations and screening exams.  Did review patient's personal, surgical, social histories.  Patient up-to-date on all age-appropriate vaccinations would like.  Administer first shingles vaccine in office today.  Patient is up-to-date on CRC screening.  PSA for prostate cancer screening today.  Patient was given information at discharge about preventative healthcare maintenance with anticipatory guidance.      Relevant Orders   Comprehensive metabolic panel with GFR   CBC with Differential/Platelet   TSH   Frequent headaches   History of the same.  Patient's headaches have increased in frequency.  We will increase nortriptyline  to 20 mg nightly and continue sumatriptan  50 mg daily as needed.      Relevant Medications   nortriptyline  (PAMELOR ) 10 MG capsule   SUMAtriptan  (IMITREX ) 50 MG tablet   Other Relevant Orders   TSH   Obesity (BMI 30-39.9)   History of the same.  Pending TSH, A1c, lipid panel.      Relevant Orders   Hemoglobin A1c   Lipid panel    Hypercholesteremia   History of the same.  Patient  currently maintained on rosuvastatin  10 mg daily.  Pending lipid panel      Relevant Orders   Hemoglobin A1c   Lipid panel   Other Visit Diagnoses       Encounter for hepatitis C screening test for low risk patient       Relevant Orders   Hepatitis C antibody     Encounter for screening for HIV       Relevant Orders   HIV antibody (with reflex)     Screening for prostate cancer       Relevant Orders   PSA       Return in about 3 months (around 03/08/2024) for headahces/second shingles vaccine .    Adina Crandall, NP

## 2023-12-07 NOTE — Assessment & Plan Note (Signed)
 History of the same.  Pending TSH, A1c, lipid panel.

## 2023-12-07 NOTE — Assessment & Plan Note (Signed)
 History of the same.  Patient's headaches have increased in frequency.  We will increase nortriptyline  to 20 mg nightly and continue sumatriptan  50 mg daily as needed.

## 2023-12-07 NOTE — Assessment & Plan Note (Signed)
 History of the same.  Patient currently maintained on rosuvastatin  10 mg daily.  Pending lipid panel

## 2023-12-08 ENCOUNTER — Ambulatory Visit: Payer: Self-pay | Admitting: Nurse Practitioner

## 2023-12-08 DIAGNOSIS — R7303 Prediabetes: Secondary | ICD-10-CM | POA: Insufficient documentation

## 2023-12-08 LAB — HEPATITIS C ANTIBODY: Hepatitis C Ab: NONREACTIVE

## 2023-12-08 LAB — HIV ANTIBODY (ROUTINE TESTING W REFLEX): HIV 1&2 Ab, 4th Generation: NONREACTIVE

## 2024-01-01 ENCOUNTER — Other Ambulatory Visit: Payer: Self-pay | Admitting: Nurse Practitioner

## 2024-01-01 DIAGNOSIS — R519 Headache, unspecified: Secondary | ICD-10-CM

## 2024-01-03 IMAGING — DX DG SHOULDER 2+V*R*
3 series · 3 of 3 positions shown · non-contrast
Comparison: None.

CLINICAL DATA: Chronic right shoulder pain.

EXAM:
RIGHT SHOULDER - 2+ VIEW

[shoulder (grashey view) ap]
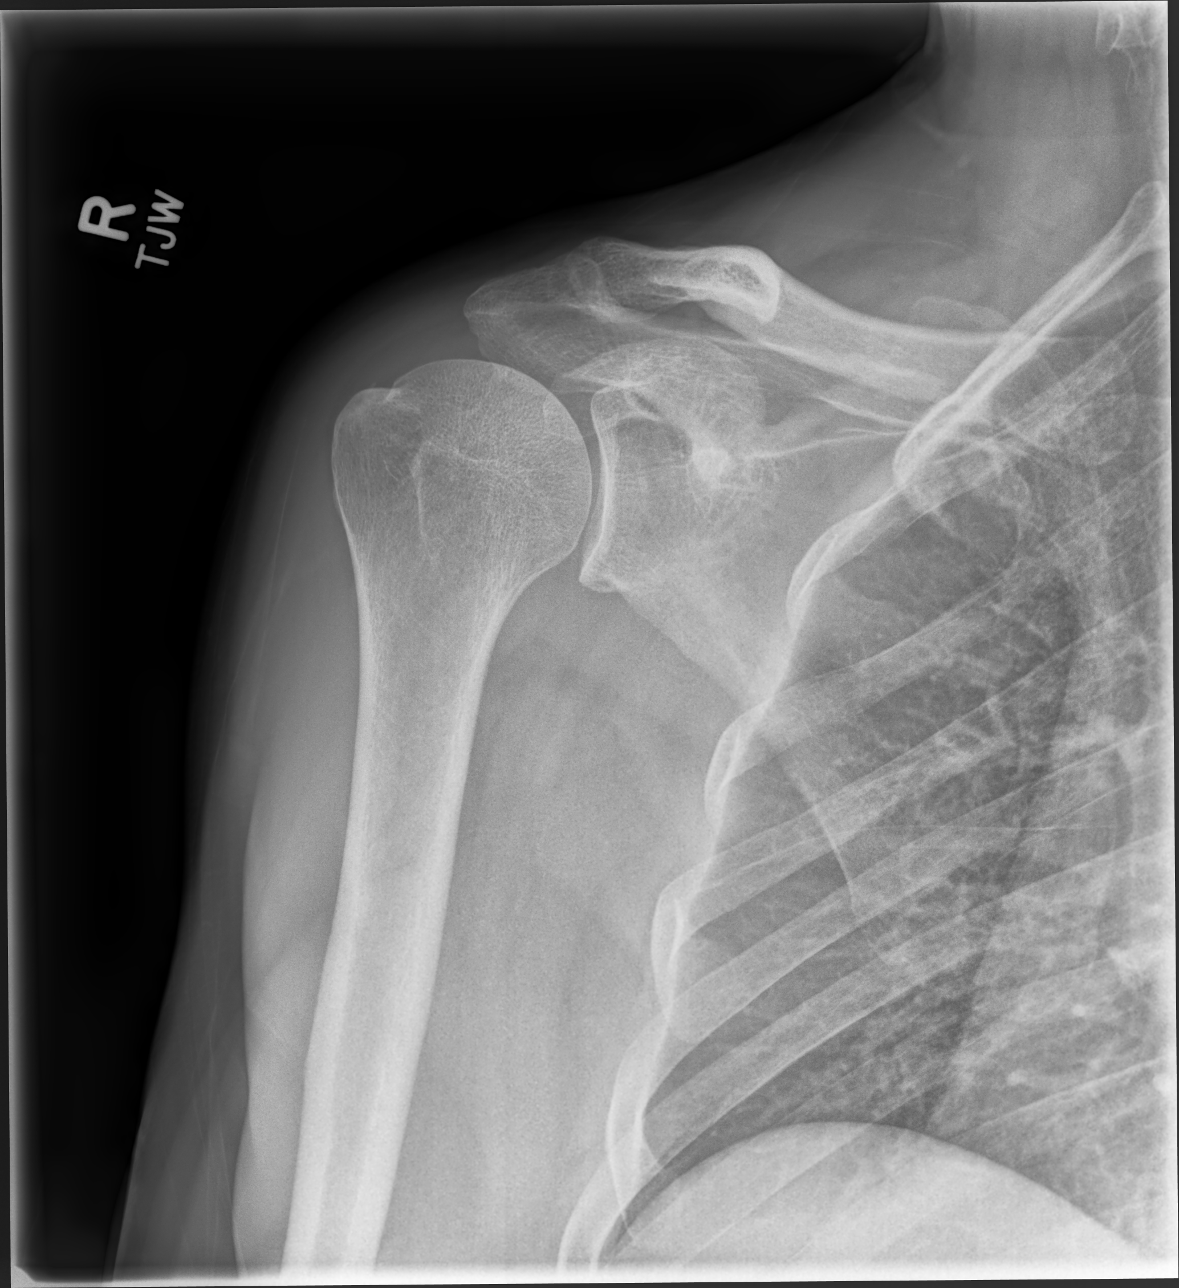

[shoulder (y view) lat]
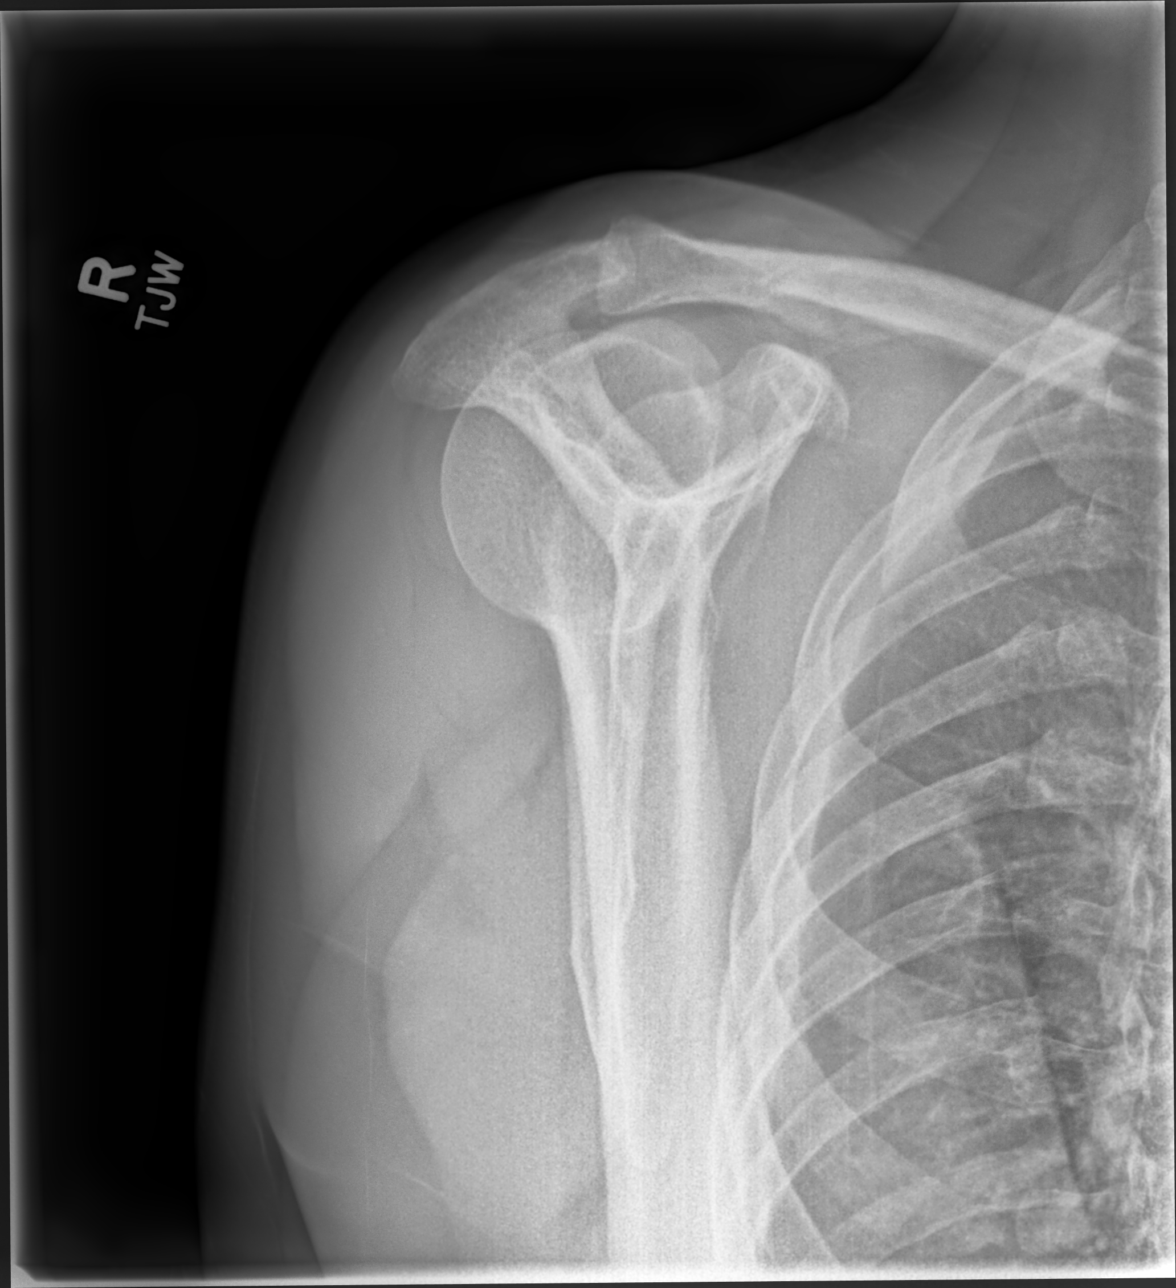

[shoulder axial]
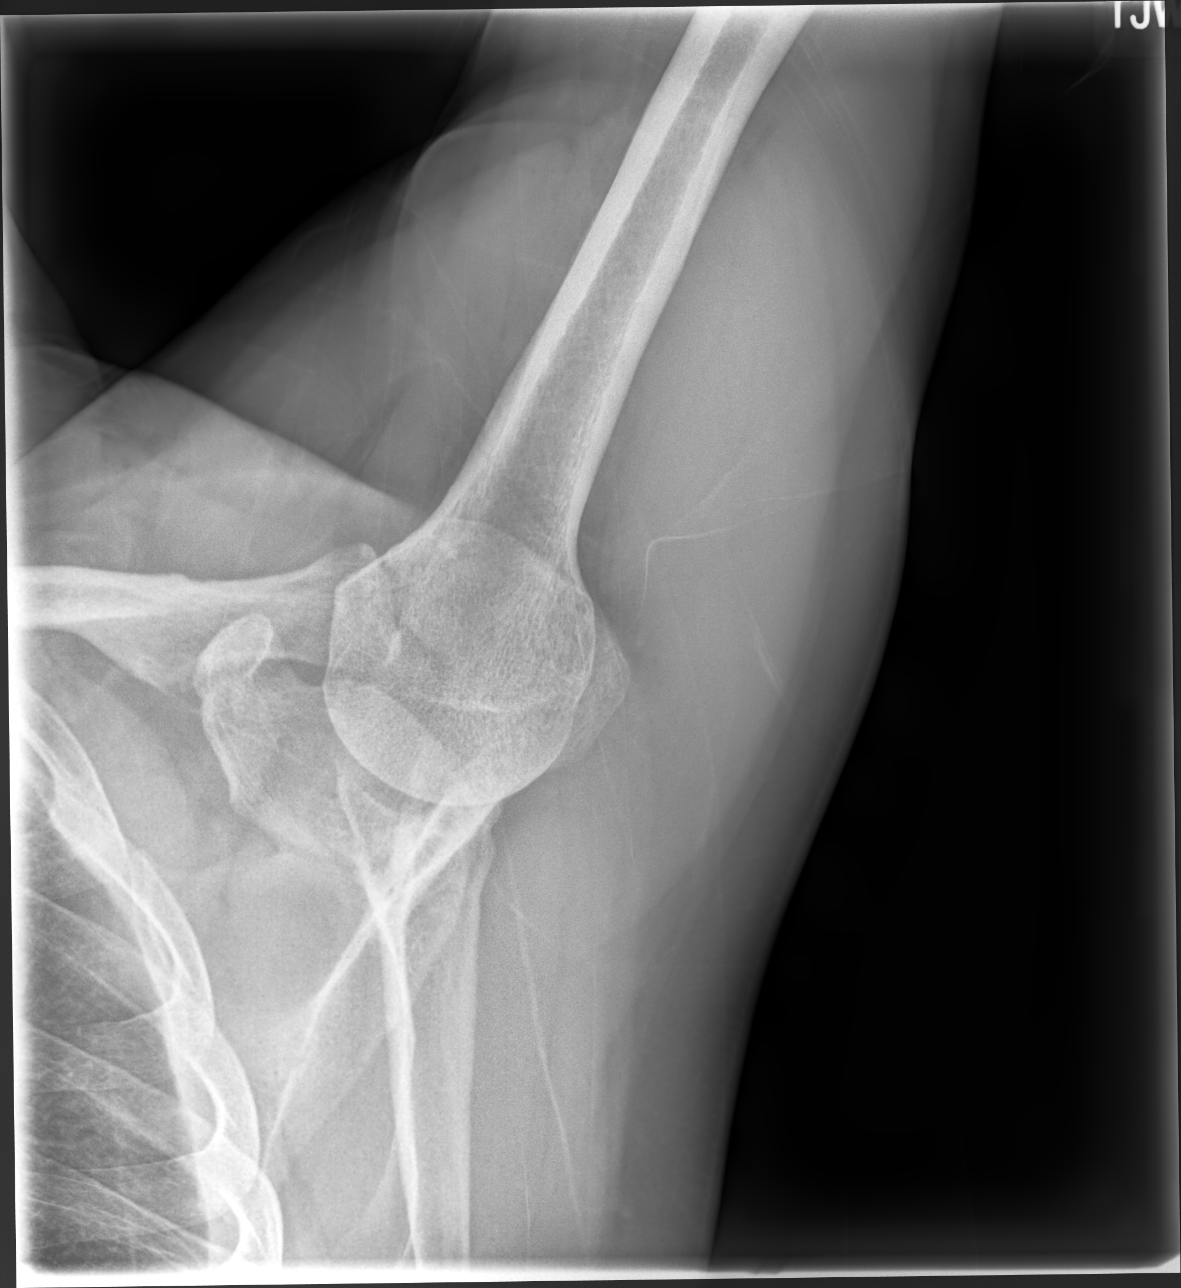

[3 of 3 positions shown; findings below may reference images not displayed]

FINDINGS: Normal bone mineralization. The glenohumeral joint space is
maintained. Possible minimal peripheral acromioclavicular
degenerative osteophytosis. No acute fracture is seen. No
dislocation. The visualized portion of the right lung is
unremarkable.
IMPRESSION: Minimal acromioclavicular osteoarthritis.

## 2024-01-13 ENCOUNTER — Other Ambulatory Visit: Payer: Self-pay | Admitting: Nurse Practitioner

## 2024-01-13 DIAGNOSIS — E7800 Pure hypercholesterolemia, unspecified: Secondary | ICD-10-CM

## 2024-02-09 ENCOUNTER — Other Ambulatory Visit: Payer: Self-pay | Admitting: Nurse Practitioner

## 2024-02-09 DIAGNOSIS — R519 Headache, unspecified: Secondary | ICD-10-CM

## 2024-03-12 ENCOUNTER — Other Ambulatory Visit: Payer: Self-pay | Admitting: Nurse Practitioner

## 2024-03-12 ENCOUNTER — Other Ambulatory Visit: Payer: Self-pay | Admitting: Family

## 2024-03-12 DIAGNOSIS — E7800 Pure hypercholesterolemia, unspecified: Secondary | ICD-10-CM

## 2024-03-12 DIAGNOSIS — R519 Headache, unspecified: Secondary | ICD-10-CM

## 2024-03-15 ENCOUNTER — Encounter: Payer: Self-pay | Admitting: Nurse Practitioner

## 2024-03-15 DIAGNOSIS — E7800 Pure hypercholesterolemia, unspecified: Secondary | ICD-10-CM

## 2024-03-16 MED ORDER — ROSUVASTATIN CALCIUM 10 MG PO TABS: 10.0000 mg | ORAL_TABLET | Freq: Every day | ORAL | 1 refills | Status: AC

## 2024-04-10 ENCOUNTER — Other Ambulatory Visit: Payer: Self-pay | Admitting: Nurse Practitioner

## 2024-04-10 DIAGNOSIS — R519 Headache, unspecified: Secondary | ICD-10-CM

## 2024-06-11 ENCOUNTER — Other Ambulatory Visit: Payer: Self-pay | Admitting: Nurse Practitioner

## 2024-06-11 DIAGNOSIS — R519 Headache, unspecified: Secondary | ICD-10-CM

## 2024-06-16 ENCOUNTER — Encounter: Payer: Self-pay | Admitting: Nurse Practitioner

## 2024-06-16 DIAGNOSIS — R519 Headache, unspecified: Secondary | ICD-10-CM
